# Patient Record
Sex: Female | Born: 1970 | Race: Black or African American | Hispanic: No | Marital: Single | Smoking: Current every day smoker
Health system: Southern US, Community
[De-identification: ages and names within clinical notes are randomized; demographics above are authoritative.]

## PROBLEM LIST (undated history)

## (undated) DIAGNOSIS — D259 Leiomyoma of uterus, unspecified: Secondary | ICD-10-CM

## (undated) DIAGNOSIS — A6000 Herpesviral infection of urogenital system, unspecified: Secondary | ICD-10-CM

## (undated) DIAGNOSIS — F32A Depression, unspecified: Secondary | ICD-10-CM

## (undated) DIAGNOSIS — B1920 Unspecified viral hepatitis C without hepatic coma: Secondary | ICD-10-CM

## (undated) DIAGNOSIS — D509 Iron deficiency anemia, unspecified: Secondary | ICD-10-CM

## (undated) DIAGNOSIS — N920 Excessive and frequent menstruation with regular cycle: Secondary | ICD-10-CM

## (undated) DIAGNOSIS — T7840XA Allergy, unspecified, initial encounter: Secondary | ICD-10-CM

## (undated) DIAGNOSIS — I10 Essential (primary) hypertension: Secondary | ICD-10-CM

## (undated) DIAGNOSIS — F1911 Other psychoactive substance abuse, in remission: Secondary | ICD-10-CM

## (undated) DIAGNOSIS — D649 Anemia, unspecified: Secondary | ICD-10-CM

## (undated) DIAGNOSIS — Z8742 Personal history of other diseases of the female genital tract: Secondary | ICD-10-CM

## (undated) DIAGNOSIS — F1910 Other psychoactive substance abuse, uncomplicated: Secondary | ICD-10-CM

## (undated) DIAGNOSIS — F419 Anxiety disorder, unspecified: Secondary | ICD-10-CM

## (undated) DIAGNOSIS — K219 Gastro-esophageal reflux disease without esophagitis: Secondary | ICD-10-CM

## (undated) HISTORY — PX: COLONOSCOPY WITH PROPOFOL: SHX5780

## (undated) HISTORY — DX: Gastro-esophageal reflux disease without esophagitis: K21.9

## (undated) HISTORY — DX: Essential (primary) hypertension: I10

---
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
## 1999-11-15 DIAGNOSIS — Z8619 Personal history of other infectious and parasitic diseases: Secondary | ICD-10-CM

## 1999-11-15 HISTORY — DX: Personal history of other infectious and parasitic diseases: Z86.19

## 2008-02-21 ENCOUNTER — Emergency Department (HOSPITAL_COMMUNITY): Admission: EM | Admit: 2008-02-21 | Discharge: 2008-02-21 | Payer: Self-pay | Admitting: Family Medicine

## 2008-03-04 ENCOUNTER — Ambulatory Visit: Payer: Self-pay | Admitting: Family Medicine

## 2008-03-09 ENCOUNTER — Emergency Department (HOSPITAL_COMMUNITY): Admission: EM | Admit: 2008-03-09 | Discharge: 2008-03-09 | Payer: Self-pay | Admitting: Emergency Medicine

## 2008-05-27 ENCOUNTER — Emergency Department (HOSPITAL_COMMUNITY): Admission: EM | Admit: 2008-05-27 | Discharge: 2008-05-27 | Payer: Self-pay | Admitting: Emergency Medicine

## 2008-06-05 ENCOUNTER — Encounter: Payer: Self-pay | Admitting: Internal Medicine

## 2008-06-05 ENCOUNTER — Other Ambulatory Visit: Admission: RE | Admit: 2008-06-05 | Discharge: 2008-06-05 | Payer: Self-pay | Admitting: Internal Medicine

## 2008-06-05 ENCOUNTER — Ambulatory Visit: Payer: Self-pay | Admitting: Internal Medicine

## 2008-06-05 LAB — CONVERTED CEMR LAB
AST: 25 units/L (ref 0–37)
Albumin: 4.3 g/dL (ref 3.5–5.2)
Alkaline Phosphatase: 80 units/L (ref 39–117)
BUN: 11 mg/dL (ref 6–23)
Creatinine, Ser: 0.95 mg/dL (ref 0.40–1.20)
Glucose, Bld: 87 mg/dL (ref 70–99)
HDL: 41 mg/dL (ref >39)
LDL Cholesterol: 133 mg/dL — ABNORMAL HIGH (ref 0–99)
Total Bilirubin: 0.6 mg/dL (ref 0.3–1.2)
Total CHOL/HDL Ratio: 4.7
Triglycerides: 99 mg/dL (ref <150)
VLDL: 20 mg/dL (ref 0–40)

## 2008-06-09 ENCOUNTER — Ambulatory Visit (HOSPITAL_COMMUNITY): Admission: RE | Admit: 2008-06-09 | Discharge: 2008-06-09 | Payer: Self-pay | Admitting: Family Medicine

## 2008-11-02 ENCOUNTER — Emergency Department (HOSPITAL_COMMUNITY): Admission: EM | Admit: 2008-11-02 | Discharge: 2008-11-02 | Payer: Self-pay | Admitting: Family Medicine

## 2009-02-10 ENCOUNTER — Encounter (INDEPENDENT_AMBULATORY_CARE_PROVIDER_SITE_OTHER): Payer: Self-pay | Admitting: Internal Medicine

## 2009-02-10 ENCOUNTER — Ambulatory Visit: Payer: Self-pay | Admitting: Internal Medicine

## 2009-02-10 LAB — CONVERTED CEMR LAB
ALT: 14 units/L (ref 0–35)
Alkaline Phosphatase: 75 units/L (ref 39–117)
Basophils Absolute: 0 10*3/uL (ref 0.0–0.1)
Basophils Relative: 0 % (ref 0–1)
CO2: 24 meq/L (ref 19–32)
Creatinine, Ser: 0.92 mg/dL (ref 0.40–1.20)
Eosinophils Absolute: 0.2 10*3/uL (ref 0.0–0.7)
Glucose, Bld: 111 mg/dL — ABNORMAL HIGH (ref 70–99)
MCHC: 33.7 g/dL (ref 30.0–36.0)
MCV: 83.3 fL (ref 78.0–100.0)
Monocytes Relative: 9 % (ref 3–12)
Neutrophils Relative %: 49 % (ref 43–77)
RBC: 4.42 M/uL (ref 3.87–5.11)
RDW: 15 % (ref 11.5–15.5)
Total Bilirubin: 0.3 mg/dL (ref 0.3–1.2)

## 2009-03-09 ENCOUNTER — Ambulatory Visit: Payer: Self-pay | Admitting: Internal Medicine

## 2009-03-21 ENCOUNTER — Emergency Department (HOSPITAL_COMMUNITY): Admission: EM | Admit: 2009-03-21 | Discharge: 2009-03-21 | Payer: Self-pay | Admitting: Emergency Medicine

## 2010-06-23 ENCOUNTER — Emergency Department (HOSPITAL_COMMUNITY): Admission: EM | Admit: 2010-06-23 | Discharge: 2010-06-23 | Payer: Self-pay | Admitting: Emergency Medicine

## 2010-07-07 ENCOUNTER — Ambulatory Visit: Payer: Self-pay | Admitting: Internal Medicine

## 2010-07-16 ENCOUNTER — Ambulatory Visit (HOSPITAL_COMMUNITY): Admission: RE | Admit: 2010-07-16 | Discharge: 2010-07-16 | Payer: Self-pay | Admitting: Family Medicine

## 2010-09-15 ENCOUNTER — Ambulatory Visit: Payer: Self-pay | Admitting: Obstetrics and Gynecology

## 2010-09-26 ENCOUNTER — Emergency Department (HOSPITAL_COMMUNITY): Admission: EM | Admit: 2010-09-26 | Discharge: 2010-09-26 | Payer: Self-pay | Admitting: Family Medicine

## 2010-09-30 ENCOUNTER — Emergency Department (HOSPITAL_COMMUNITY): Admission: EM | Admit: 2010-09-30 | Discharge: 2010-09-30 | Payer: Self-pay | Admitting: Emergency Medicine

## 2010-10-05 ENCOUNTER — Ambulatory Visit (HOSPITAL_COMMUNITY)
Admission: RE | Admit: 2010-10-05 | Discharge: 2010-10-05 | Payer: Self-pay | Source: Home / Self Care | Admitting: Internal Medicine

## 2010-10-14 ENCOUNTER — Ambulatory Visit: Payer: Self-pay | Admitting: Obstetrics and Gynecology

## 2010-10-14 LAB — CONVERTED CEMR LAB: Pap Smear: NEGATIVE

## 2010-12-10 ENCOUNTER — Emergency Department (HOSPITAL_COMMUNITY)
Admission: EM | Admit: 2010-12-10 | Discharge: 2010-12-10 | Payer: Self-pay | Source: Home / Self Care | Admitting: Family Medicine

## 2010-12-10 LAB — POCT RAPID STREP A (OFFICE): Streptococcus, Group A Screen (Direct): NEGATIVE

## 2010-12-11 ENCOUNTER — Emergency Department (HOSPITAL_COMMUNITY)
Admission: EM | Admit: 2010-12-11 | Discharge: 2010-12-11 | Payer: Self-pay | Source: Home / Self Care | Admitting: Emergency Medicine

## 2010-12-17 ENCOUNTER — Emergency Department (HOSPITAL_COMMUNITY)
Admission: EM | Admit: 2010-12-17 | Discharge: 2010-12-17 | Disposition: A | Discharge status: Home / Self Care | Payer: Medicaid Other | Attending: Emergency Medicine | Admitting: Emergency Medicine

## 2010-12-17 DIAGNOSIS — M5380 Other specified dorsopathies, site unspecified: Secondary | ICD-10-CM | POA: Insufficient documentation

## 2010-12-17 DIAGNOSIS — M545 Low back pain, unspecified: Secondary | ICD-10-CM | POA: Insufficient documentation

## 2011-01-07 ENCOUNTER — Emergency Department (HOSPITAL_COMMUNITY)
Admission: EM | Admit: 2011-01-07 | Discharge: 2011-01-07 | Disposition: A | Discharge status: Home / Self Care | Payer: Medicaid Other | Attending: Emergency Medicine | Admitting: Emergency Medicine

## 2011-01-07 DIAGNOSIS — H5789 Other specified disorders of eye and adnexa: Secondary | ICD-10-CM | POA: Insufficient documentation

## 2011-01-25 LAB — POCT URINALYSIS DIPSTICK
Glucose, UA: NEGATIVE mg/dL
Ketones, ur: NEGATIVE mg/dL
Specific Gravity, Urine: 1.015 (ref 1.005–1.030)

## 2011-01-25 LAB — WET PREP, GENITAL
Trich, Wet Prep: NONE SEEN
Yeast Wet Prep HPF POC: NONE SEEN

## 2011-01-25 LAB — POCT PREGNANCY, URINE: Preg Test, Ur: NEGATIVE

## 2011-01-28 LAB — URINALYSIS, ROUTINE W REFLEX MICROSCOPIC
Ketones, ur: NEGATIVE mg/dL
Nitrite: NEGATIVE
Specific Gravity, Urine: 1.026 (ref 1.005–1.030)
pH: 6 (ref 5.0–8.0)

## 2011-01-28 LAB — COMPREHENSIVE METABOLIC PANEL
ALT: 11 U/L (ref 0–35)
AST: 22 U/L (ref 0–37)
CO2: 26 mEq/L (ref 19–32)
Calcium: 9.1 mg/dL (ref 8.4–10.5)
GFR calc Af Amer: >60 mL/min (ref >60)
GFR calc non Af Amer: >60 mL/min (ref >60)
Sodium: 137 mEq/L (ref 135–145)

## 2011-01-28 LAB — DIFFERENTIAL
Basophils Relative: 0 % (ref 0–1)
Eosinophils Relative: 1 % (ref 0–5)
Lymphs Abs: 2.6 10*3/uL (ref 0.7–4.0)
Monocytes Absolute: 0.8 10*3/uL (ref 0.1–1.0)

## 2011-01-28 LAB — CBC
Hemoglobin: 12.8 g/dL (ref 12.0–15.0)
MCHC: 35.5 g/dL (ref 30.0–36.0)
RBC: 4.34 MIL/uL (ref 3.87–5.11)
WBC: 8.8 10*3/uL (ref 4.0–10.5)

## 2011-01-28 LAB — POCT PREGNANCY, URINE: Preg Test, Ur: NEGATIVE

## 2011-02-11 ENCOUNTER — Ambulatory Visit (INDEPENDENT_AMBULATORY_CARE_PROVIDER_SITE_OTHER): Payer: Medicaid Other | Admitting: Obstetrics & Gynecology

## 2011-02-11 ENCOUNTER — Encounter: Payer: Self-pay | Admitting: *Deleted

## 2011-02-11 DIAGNOSIS — N898 Other specified noninflammatory disorders of vagina: Secondary | ICD-10-CM

## 2011-02-11 LAB — CONVERTED CEMR LAB: WBC, Wet Prep HPF POC: NONE SEEN

## 2011-02-12 NOTE — Progress Notes (Signed)
Latoya Klein, GLADUE NO.:  [id number]  MEDICAL RECORD NO.:  [id number]           PATIENT TYPE:  A  LOCATION:  [facility]                   FACILITY:  [facility]  PHYSICIAN:  Scheryl Darter, MD       DATE OF BIRTH:  1971-05-19  DATE OF SERVICE:  02/11/2011                                 CLINIC NOTE  REASON FOR VISIT:  Vaginal discharge.  HISTORY OF PRESENT ILLNESS:  The patient complaints today of a vaginal discharge, this is white in color.  She also reports some odor and itching for the last week.  She states that this is similar to a previous C-section which got better with oral Diflucan.  She has not started with any treatments so far.  She does not have any new partner and she has no concern for STDs.  She denies any pain with intercourse, bleeding, cramping or abdominal pain.  PHYSICAL EXAMINATION:  VITAL SIGNS:  Temperature 98.0, pulse 67, blood pressure 132/89, weight 186.6. GENERAL:  She is alert in no acute distress, resting comfortably on exam table. ABDOMEN:  Obese, nontender, nondistended.  No palpable masses or hepatosplenomegaly. GYNECOLOGY:  External genitalia is normal, although a thick white discharge is present at the introitus.  Vagina with no lesions, normal rugation.  A thick white discharge is present.  Cervix without lesions or discharge, normal color.  Allergies, no known drug allergies.  Date of last menstrual period February 02, 2011.  Date of last Pap October 14, 2010.  ASSESSMENT AND PLAN:  This is a 40 year old female with 1 week of thick white vaginal discharge.  PLAN:  We will treat with Diflucan 150 mg p.o., repeat in 3 days.  Also once wet prep results are back, if BV is present, we will treat her for that as well.  The patient should follow up for annual exam in December what will be 1 year since her last exam.    ______________________________ Ellery Plunk, MD   ______________________________ Scheryl Darter,  MD   RS/MEDQ  D:  02/11/2011  T:  02/12/2011  Job:  [id number]

## 2011-02-22 LAB — POCT RAPID STREP A (OFFICE): Streptococcus, Group A Screen (Direct): NEGATIVE

## 2011-02-22 LAB — POCT INFECTIOUS MONO SCREEN: Mono Screen: NEGATIVE

## 2011-05-26 ENCOUNTER — Encounter: Payer: Self-pay | Admitting: Physician Assistant

## 2011-05-26 ENCOUNTER — Ambulatory Visit (INDEPENDENT_AMBULATORY_CARE_PROVIDER_SITE_OTHER): Payer: Medicaid Other | Admitting: Physician Assistant

## 2011-05-26 ENCOUNTER — Other Ambulatory Visit: Payer: Self-pay | Admitting: Physician Assistant

## 2011-05-26 VITALS — BP 138/89 | HR 78 | Temp 98.5°F | Ht 66.0 in | Wt 178.7 lb

## 2011-05-26 DIAGNOSIS — N898 Other specified noninflammatory disorders of vagina: Secondary | ICD-10-CM

## 2011-05-26 DIAGNOSIS — N760 Acute vaginitis: Secondary | ICD-10-CM | POA: Insufficient documentation

## 2011-05-26 DIAGNOSIS — Z113 Encounter for screening for infections with a predominantly sexual mode of transmission: Secondary | ICD-10-CM

## 2011-05-26 LAB — RPR

## 2011-05-26 NOTE — Patient Instructions (Signed)
Sexually Transmitted Disease Sexually transmitted disease (STD) refers to any infection that is passed from person to person during sexual activity. This may happen by way of saliva, semen, blood, vaginal mucus, or urine. Infections are passed in many ways. For example, the common cold could be passed during sexual activity, but it is not considered a sexually transmitted infection. CAUSES Sexual activity is the contact between the genitals of one partner and the genitals, anus, eyes, mouth, or throat of another. These activities include sexual intercourse, the sharing of sex toys, needles, or any other intimate contact of the genitals, mouth, or rectal areas. An STD may be spread by bacteria (germ), virus, or parasite. These small germ-like agents can enter the body and infect the skin and linings of the:  Vagina.  Rectum.   Urethra.   Cervix.  Eyes.   Mouth.  Throat.   HIV/AIDS and hepatitis B infection can also be spread by needles, through the blood. Common STDs include:  Gonorrhea.  Chlamydia.   Syphilis.   HIV/AIDS (human immunodeficiency virus / acquired immunodeficiency syndrome).   Genital herpes.  Hepatitis B.   Trichomonas.   Human papilloma virus (HPV).   Pubic lice.   SYMPTOMS Some people may not have any symptoms, but can still transmit the infection to others.  Painful or bloody urination.   Pain in the pelvis, abdomen, vagina, anus, throat, or eyes. Where it hurts depends on the type of sexual contact.    Skin rash, itching, irritation, growths, or lesions (sores, ulcer). These usually occur in the genital or anal area. These growths may or may not be painful, irritated, or itch.   Abnormal vaginal discharge.   Fever.   Pain or bleeding during sexual intercourse.   Swollen glands in the groin area.   Yellow skin and eyes, seen with hepatitis (jaundice).  RISK FACTORS   Having multiple sex partners.   Having a sex partner who has other sex  partners.   Taking illegal drugs or drinking too much alcohol. This can affect your judgment and put you in a vulnerable position.   Having unprotected sex, not using condoms.   Having open sores on your skin or in your mouth, during sexual activity.   Being involved in high-risk sexual acts.   Engaging in oral or anal sex.Marland Kitchen  DIAGNOSIS  Diagnosis of sexually transmitted infections begins with your caregiver taking your medical history and performing a physical exam. Additional testing may be required.   Cultures are used to diagnose some STDs, including gonorrhea and chlamydia. A specimen is taken, grown in the laboratory for a day or two, and then identified. Newer tests can diagnose certain STDs, such as chlamydia, within minutes.   Trichomonas or syphilis can be seen through a microscope, in a sample of discharge.   Syphilis can be seen through a microscope or diagnosed with a blood test.   HIV and hepatitis B can be diagnosed with a blood test.   Pubic lice, which look like tiny bugs in the pubic hair, can usually be seen with a magnifying glass or a microscope.   The appearance of sores or blisters on the skin is enough to make a diagnosis and begin treatment for genital herpes.   HPV (human papilloma virus) is seen on a Pap test. There are several types of HPV that can cause cervical cancer.   Colposcopy (applying special solution and looking at the cervix with a lighted, magnifying tube) is used to see the problem  better when diagnosing HPV.   Laparoscopy (looking into the pelvis at the female organs, with a lighted tube, through a small incision) can also be used for diagnosis.  TREATMENT  Chlamydia, gonorrhea, trichomonas, and syphilis can be cured with antibiotics (injected, oral, vaginal creams, suppositories). These are medicines that kill germs.   Genital herpes, hepatitis, and HIV cannot be cured. They often can be treated with medicines, to lessen the symptoms.    Genital warts from HPV can be removed with medicine, freezing, electrocautery (burning), or surgery. But the warts may come back.   All sexual partners should be informed, tested, and treated for all STDs.   Surgery can be used for removal of HPV of the cervix, vagina, or vulva.   If you have one STD, you are also at risk for all others. If one is discovered, treatment often will be started to cover other possible infections. This may be done even if other infections are not proven with testing.  HOME CARE INSTRUCTIONS AND PREVENTION  Finish all medicine as prescribed. Incomplete treatment of chlamydia and gonorrhea puts women at risk of becoming sterile (unable to have children). Women are also at risk of having a tubal pregnancy (pregnancy outside the uterus), which can be life threatening. Sterility or tubal pregnancies can occur even in fully treated individuals. Following the prescribed treatment decreases the chance. That is why it is important to finish ALL medicines given to you.   Return immediately if you develop an oral temperature of 100.5 or higher.   Only take over-the-counter or prescription medicines for pain, discomfort, or fever as directed by your caregiver.   Rest and eat a balanced diet with plenty of fluids.   Do not have sex until treatment is completed and you have followed up at your caregiver's office or the clinic to which you were referred. If your diagnosis is confirmed by culture or another method, your recent sexual partners need treatment. This is true even if they are problem free or have a negative culture or evaluation. They also should not have sex until their caregiver says it is ok.  STDs should be checked after treatment.  HIV and hepatitis need frequent blood tests and follow-up examinations. This is to monitor the effects of the infection on your body. Any new or worsening symptoms should be reported to your caregiver.   HPV needs follow-up Pap  tests.   Only use latex condoms and water-soluble lubricants. Do not use vaseline or oils.   Avoid alcohol and illegal drugs, because they can affect your mind, and you may end up not practicing safe sex.   A vaccine is available for HPV and hepatitis. Everyone should get the vaccine.   Avoid risky sex practices that can break the skin, because it makes you more at risk for an STD.   Oral, vaginal ring, patches, hormone injection contraceptives, spermicides, diaphragm, IUD, and cervical caps do not protect against STDs.  PROGNOSIS Long-term effects vary, depending on the type and severity of the STD, and the effectiveness of the treatment. An STD can be treated and cured in one week, one or more months, or an STD and its symptoms can last for many years, or even a lifetime (HIV and hepatitis). STDs can also cause damage to the female organs, chronic pelvic pain, infertility, and recurrences of the STD, especially genital herpes, hepatitis, and HIV.   Trichomonas and pubic lice have few or no long-term effects, other than continued symptoms.  Frequent STDs can cause:   Chronic (ongoing) pelvic pain, or closing of the urethra in the penis.   Chlamydia and gonorrhea can cause:   Infertility.   Chronic pelvic inflammatory disease, and chronic pain.   HPV infections increase a woman's risk of having abnormal cells in her cervix and developing cervical cancer.   HPV causes genital warts, which can come back even after treatment.   Several types of HPV (not warts) cause cervical cancer. HPV is the main cause of cervical cancer.   HIV can progress to AIDS and result in death.    Hepatitis B can cause permanent liver damage, liver cancer, and death.    Syphilis can be cured in the early stages. But in advanced stages, it causes permanent brain and heart damage, and death.   Syphilis during pregnancy, if not treated, can cause:   Deformities.   Death of the baby.  WARNING:You have  been diagnosed with an STD, or you may have an STD. All sexually transmitted infections are contagious. People who have an STD or are being evaluated for a possible STD should not have sexual contact with another person until they receive treatment, until the infection has cleared, or until tests are negative for STD. All STDs can be transmitted to babies before or during birth. Effects of STD infection on babies depend on the infection and the effectiveness of treatment. Effects can include infections, birth defects, and even death. SEEK MEDICAL CARE IF:  You think you have an STD, even if you do not have symptoms. Call your caregiver for evaluation and treatment, if needed.   You think or know your sex partner has an STD.   You have any of the symptoms mentioned above.  Document Released: 01/21/2003 Document Re-Released: 01/25/2010 [facility] Patient Information 2011 [place], [place].Preparing for Pregnancy Preparing for pregnancy (preconceptual care) by getting counseling and information from your caregiver before getting pregnant is a good idea. It will help you and your baby have a better chance to have a healthy, safe pregnancy and delivery of your baby. Make an appointment with your caregiver to talk about your health, medical and family history and how to prepare yourself before getting pregnant. Your caregiver will do a complete physical exam and a Pap test. They will want to know:  About you, your spouse/partner and your family's medical and genetic history.   If you are eating a balanced diet and drinking enough fluids.   What vitamins and mineral supplements you are taking. This includes taking folic acid before getting pregnant to help prevent birth defects.   What medications you are taking including prescription, over-the-counter and herbal medications.   If there is any substance abuse like alcohol, smoking and illegal drugs.   If there is any mental or physical domestic violence.    If there is any risk of sexually transmitted disease between you and your partner.   What immunizations and vaccinations you have had and what you may need before getting pregnant.   If you should get tested for HIV infection.   If there is any exposure to chemical or toxic substances at home or work.   If there are medical problems you have that need to be treated and kept under control before getting pregnant such as diabetes, high blood pressure or others.   If there were any past surgeries, pregnancies and problems with them.   What your current weight is and to set a goal as to how much weight  you should gain while pregnant. Also, they will check if you should lose or gain weight before getting pregnant.   What is your exercise routine and what it is safe when you are pregnant.   If there are any physical disabilities that need to be addressed.   About spacing your pregnancies when there are other children.   If there is a financial problem that may affect you having a child.  After talking about the above points with your caregiver, your caregiver will give you advice on how to help treat and work with you on solving any issues, if necessary, before getting pregnant. The goal is to have a healthy and safe pregnancy for you and your baby. You should keep an accurate record of your menstrual periods because it will help in determining your due date. Immunizations that you should have before getting pregnant:   Regular measles, [place] measles (rubella) and mumps.  Tetanus and diphtheria.   Chicken pox, if not immune.   Herpes zoster (Varicella) if not immune.  Human papilloma virus vaccine (HPV) between the age of 35 and 49 years old).   Hepatitis A vaccine.  Hepatitis B vaccine.   Influenza vaccine.   Pneumococcal vaccine (pneumonia).   You should avoid getting pregnant for one month after getting vaccinated with a live virus vaccine such as [place] measles (rubella)  vaccine. Other immunizations may be necessary depending on where you live, such as malaria. Ask your caregiver if any other immunizations are needed for you. HOME CARE INSTRUCTIONS  Follow the advice of your caregiver.   Before getting pregnant:   Begin taking vitamins, supplements and folic acid 0.4 milligrams/day.   Get your immunizations up to date.   Get help from a nutrition counselor if you do not understand what a balanced diet is, need help with a special medical diet or if you need help to lose or gain weight.   Begin exercising.   Stop smoking, taking illegal drugs and drinking alcoholic beverages.   Get counseling if there is and type of domestic violence.   Get checked for sexually transmitted diseases including HIV.   Get any medical problems under control (diabetes, high blood pressure, convulsions, asthma or others).   Resolve any financial concerns.   Be sure you and your spouse/partner are ready to have a baby.   Keep an accurate record of your menstrual periods.  Document Released: 10/13/2008  [facility] Patient Information 2011 [place], [place].Vaginitis Vaginitis in a soreness, swelling and redness (inflammation) of the vagina and vulva. This is not a sexually transmitted infection.   CAUSES Yeast vaginitis is caused by yeast (candida) that is normally found in your vagina. With a yeast infection, the candida has over grown in number to a point that upsets the chemical balance. SYMPTOMS   White thick vaginal discharge.   Swelling, itching, redness and irritation of the vagina and possibly the lips of the vagina (vulva).   Burning or painful urination.   Painful intercourse.  HOME CARE INSTRUCTIONS  Finish all medication as prescribed.   Do not have sex until treatment is completed or instructed by your healthcare giver.   Take warm sitz baths.   Do not douche.   Do not use tampons, especially scented ones.   Wear cotton underwear.   Avoid  tight pants and panty hose.   Tell your sexual partner that you have a yeast infection. They should go to their caregiver if they have symptoms such as mild rash  or itching.   Your sexual partner should be treated if your infection is difficult to eliminate.   Practice safer sex. Use condoms.   Some vaginal medications cause latex condoms to fail. Ask your caregiver this.  SEEK MEDICAL CARE IF:  You develop a fever.   The infection is getting worse after 2 days of treatment.   The infection is not getting better after 3 days of treatment.   You develop blisters in or around your vagina.   You develop vaginal bleeding, and it is not your menstrual period.   You have pain when you urinate.   You develop intestinal problems.   You have pain with sexual intercourse.  Document Released: 12/08/2004 Document Re-Released: 08/28/2009 [facility] Patient Information 2011 [place], [place].Place vaginitis patient instructions here.

## 2011-05-26 NOTE — Progress Notes (Signed)
Vaginal discharge x 2 weeks slight odor

## 2011-05-26 NOTE — Progress Notes (Signed)
  Subjective:    Latoya Klein is a 40 y.o. female who presents for sexually transmitted disease check. Sexual history reviewed with the patient. STI Exposure: Reports new sexual partner since last STI screening. Previous history of STI none. Current symptoms vaginal discharge: yellow and thick x 2 weeks. Contraception: None, desiring pregnancy Menstrual History:   Patient's last menstrual period was 05/16/2011.    The following portions of the patient's history were reviewed and updated as appropriate: allergies, current medications, past family history, past medical history, past social history, past surgical history and problem list.  Review of Systems Pertinent items are noted in HPI.    Objective:    BP 138/89  Pulse 78  Temp(Src) 98.5 F (36.9 C) (Oral)  Ht 5\' 6"  (1.676 m)  Wt 178 lb 11.2 oz (81.058 kg)  BMI 28.84 kg/m2  LMP 05/16/2011 General:   alert, cooperative, appears stated age and no distress  Lymph Nodes:   Cervical, supraclavicular, and axillary nodes normal.  Pelvis:  External genitalia: normal general appearance Vaginal: normal mucosa without prolapse or lesions, normal without tenderness, induration or masses and discharge, Brown Cervix: normal appearance Adnexa: normal bimanual exam Uterus: normal single, nontender Clinical staff offered to be present for exam: yes  Initials: [facility]  Cultures:  GC and Chlamydia genprobes and Wet prep     Assessment:    Possible STD exposure    Plan:    Discussed safe sexual practice in detail See orders for STD cultures and assays See orders. Pt will call for results RTC PRN

## 2011-05-27 LAB — CHLAMYDIA TRACHOMATIS, DNA, AMP PROBE: Chlamydia, DNA Probe: NEGATIVE

## 2011-05-27 LAB — WET PREP, GENITAL: Clue Cells Wet Prep HPF POC: NONE SEEN

## 2011-05-27 LAB — HEPATITIS C ANTIBODY: HCV Ab: REACTIVE — AB

## 2011-05-30 ENCOUNTER — Other Ambulatory Visit: Payer: Medicaid Other

## 2011-05-30 DIAGNOSIS — B1920 Unspecified viral hepatitis C without hepatic coma: Secondary | ICD-10-CM

## 2011-05-30 DIAGNOSIS — Z0189 Encounter for other specified special examinations: Secondary | ICD-10-CM

## 2011-05-30 LAB — HEPATIC FUNCTION PANEL
Albumin: 4.6 g/dL (ref 3.5–5.2)
Total Protein: 8 g/dL (ref 6.0–8.3)

## 2011-06-29 ENCOUNTER — Ambulatory Visit: Payer: Medicaid Other | Admitting: Obstetrics & Gynecology

## 2011-07-24 ENCOUNTER — Emergency Department (HOSPITAL_COMMUNITY)
Admission: EM | Admit: 2011-07-24 | Discharge: 2011-07-24 | Disposition: A | Discharge status: Home / Self Care | Payer: Medicaid Other | Attending: Emergency Medicine | Admitting: Emergency Medicine

## 2011-07-24 DIAGNOSIS — A499 Bacterial infection, unspecified: Secondary | ICD-10-CM | POA: Insufficient documentation

## 2011-07-24 DIAGNOSIS — R1032 Left lower quadrant pain: Secondary | ICD-10-CM | POA: Insufficient documentation

## 2011-07-24 DIAGNOSIS — N760 Acute vaginitis: Secondary | ICD-10-CM | POA: Insufficient documentation

## 2011-07-24 DIAGNOSIS — B9689 Other specified bacterial agents as the cause of diseases classified elsewhere: Secondary | ICD-10-CM | POA: Insufficient documentation

## 2011-07-24 LAB — URINALYSIS, ROUTINE W REFLEX MICROSCOPIC
Bilirubin Urine: NEGATIVE
Hgb urine dipstick: NEGATIVE
Ketones, ur: NEGATIVE mg/dL
Specific Gravity, Urine: 1.024 (ref 1.005–1.030)
Urobilinogen, UA: 1 mg/dL (ref 0.0–1.0)

## 2011-07-24 LAB — POCT PREGNANCY, URINE: Preg Test, Ur: NEGATIVE

## 2011-07-24 LAB — WET PREP, GENITAL: Trich, Wet Prep: NONE SEEN

## 2011-07-25 LAB — GC/CHLAMYDIA PROBE AMP, GENITAL
Chlamydia, DNA Probe: NEGATIVE
GC Probe Amp, Genital: NEGATIVE

## 2011-08-09 LAB — POCT URINALYSIS DIP (DEVICE)
Nitrite: NEGATIVE
Protein, ur: NEGATIVE
Specific Gravity, Urine: <=1.005
Urobilinogen, UA: 0.2

## 2011-08-09 LAB — URINE CULTURE

## 2011-08-09 LAB — POCT PREGNANCY, URINE: Preg Test, Ur: NEGATIVE

## 2011-08-19 LAB — POCT RAPID STREP A: Streptococcus, Group A Screen (Direct): NEGATIVE

## 2011-08-23 IMAGING — CT CT ABD-PELV W/ CM
3 of 5 series · 14 of 32 positions shown, 19 images · IV contrast (water/omni  & 100ml omni 300)
Comparison: None.

CLINICAL DATA: Mid abdominal pain, nausea and vomiting.

CT ABDOMEN AND PELVIS WITH CONTRAST
TECHNIQUE: Multidetector CT imaging of the abdomen and pelvis was
performed following the standard protocol during bolus
administration of intravenous contrast.
Contrast: 100 mL of Omnipaque 300 IV contrast

[Series 2: routine abdomen · axial · 0.81mm/px · z∈[-400,-130]mm · 4 of 92 slices shown, 9 images]
[im 19/92  soft-tissue]
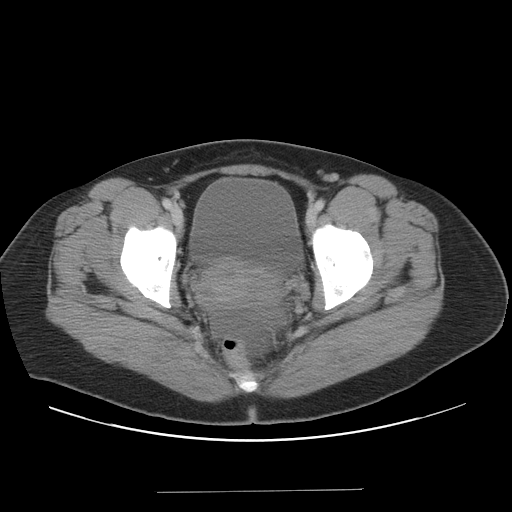
[im 19/92  lung]
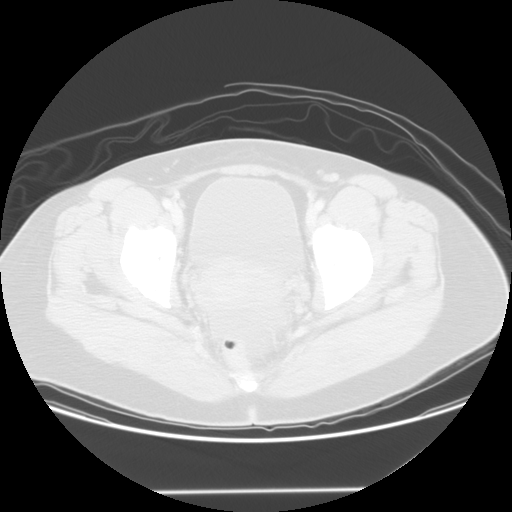
[im 19/92  bone]
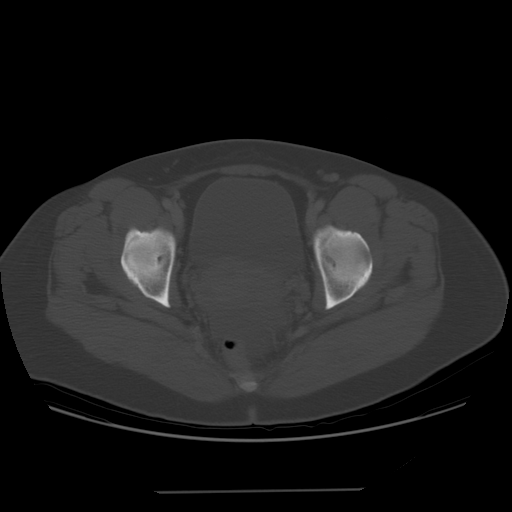
[im 37/92  soft-tissue]
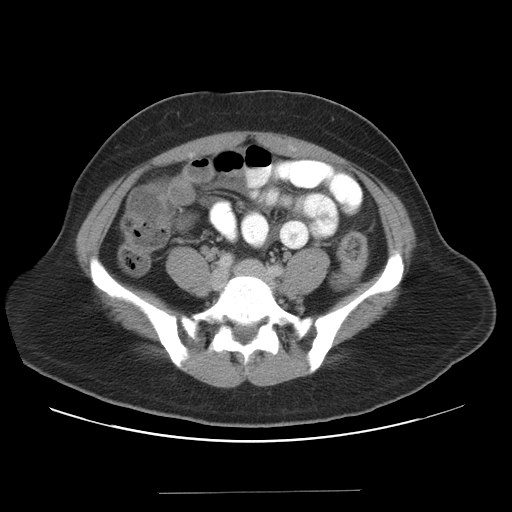
[im 37/92  lung]
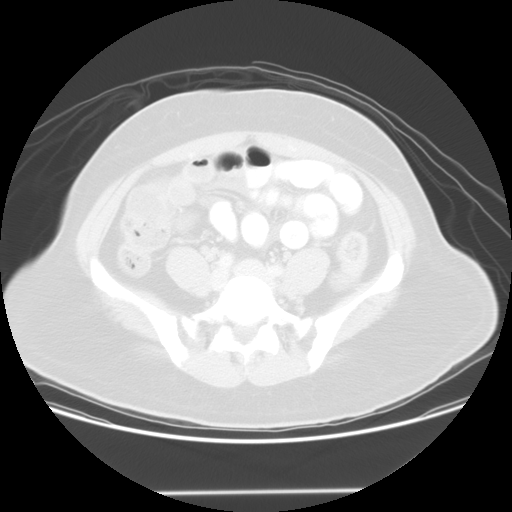
[im 55/92  soft-tissue]
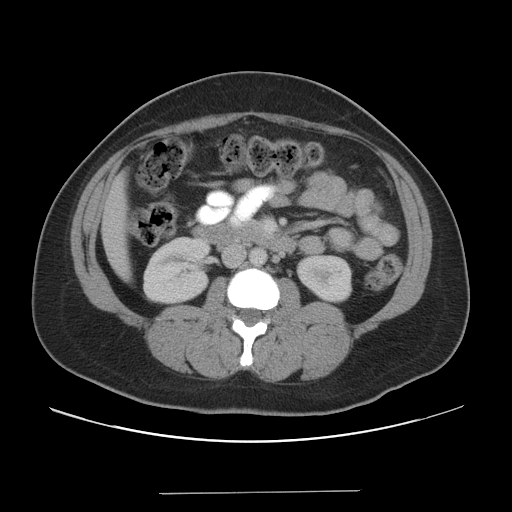
[im 55/92  lung]
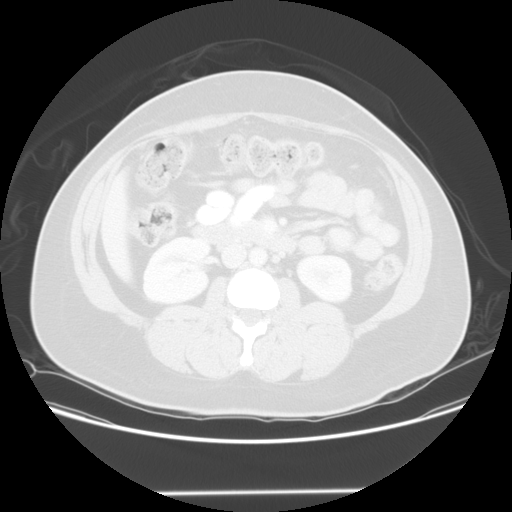
[im 73/92  soft-tissue]
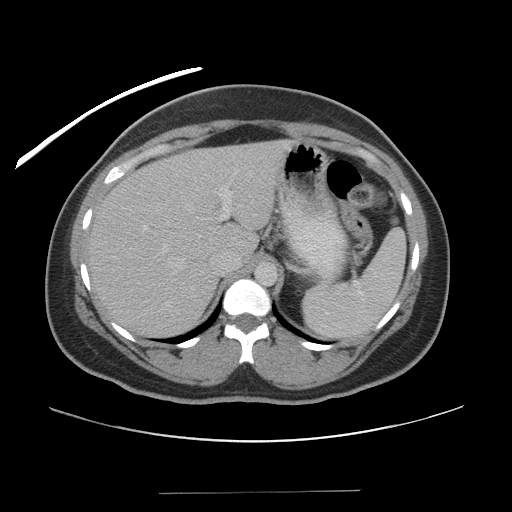
[im 73/92  lung]
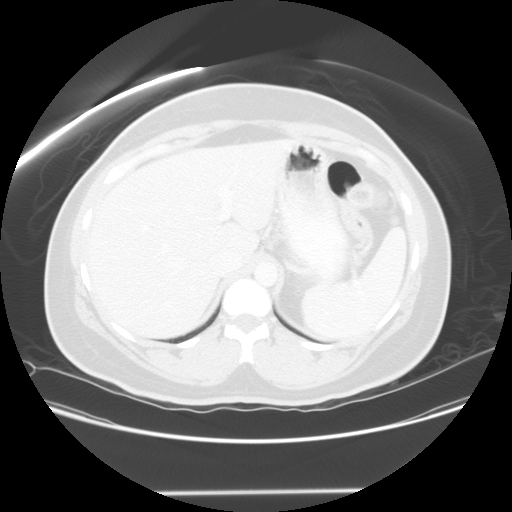

[Series 400: reformatted · sagittal · 0.94mm/px · 8 of 186 slices shown (1 of 2)]
[im 19/186  soft-tissue]
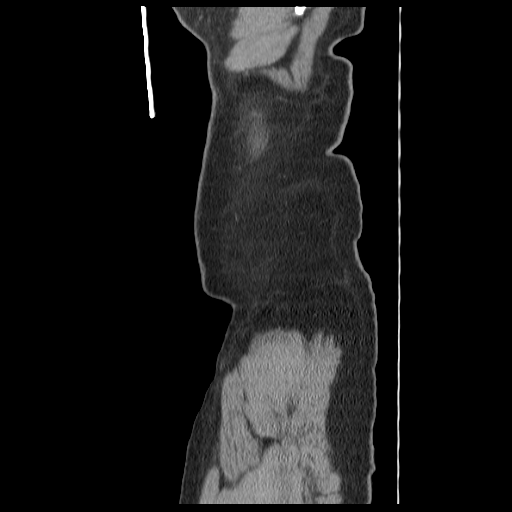
[im 38/186  soft-tissue]
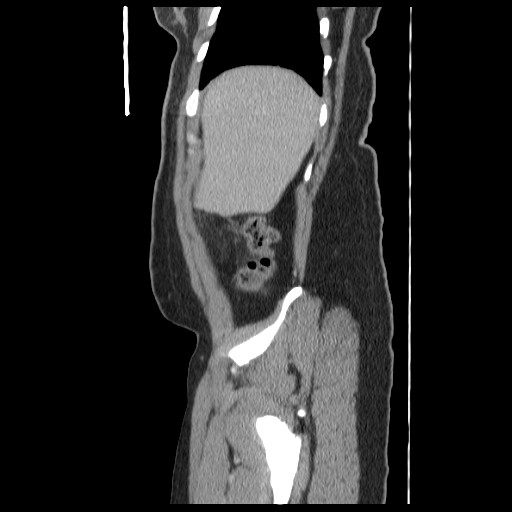
[im 56/186  soft-tissue]
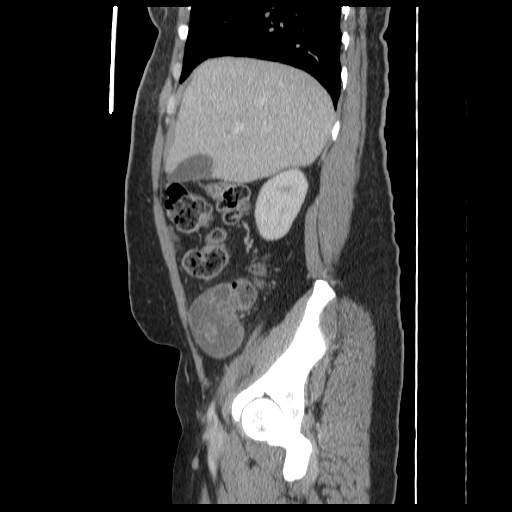
[im 75/186  soft-tissue]
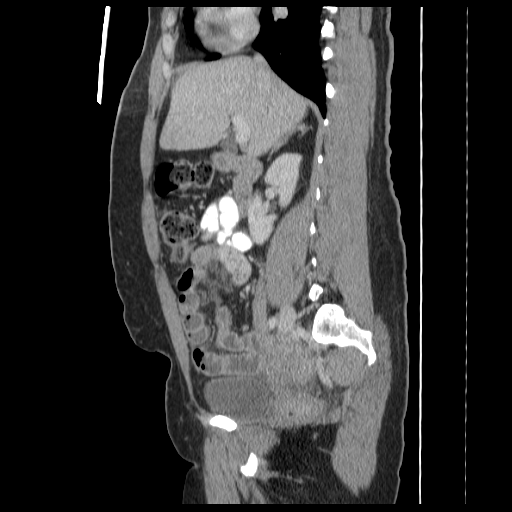
[im 112/186  soft-tissue]
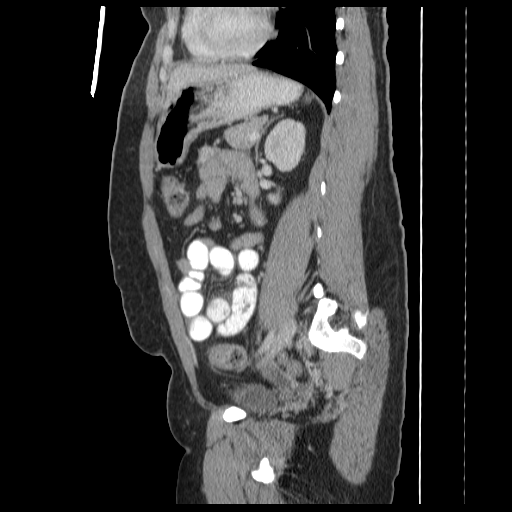
[im 130/186  soft-tissue]
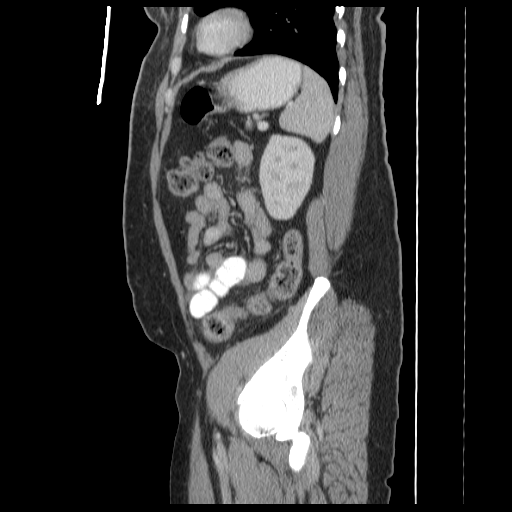
[im 149/186  soft-tissue]
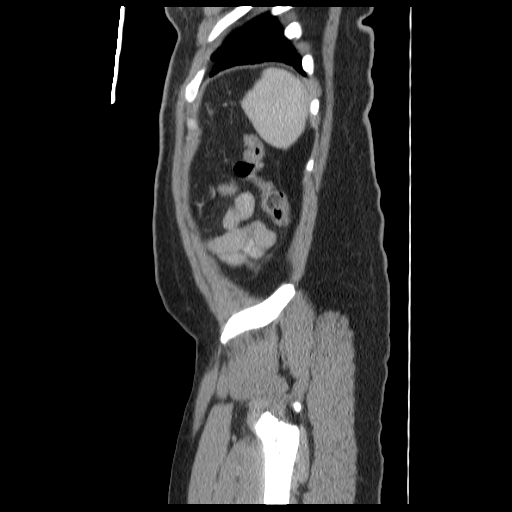
[im 167/186  soft-tissue]
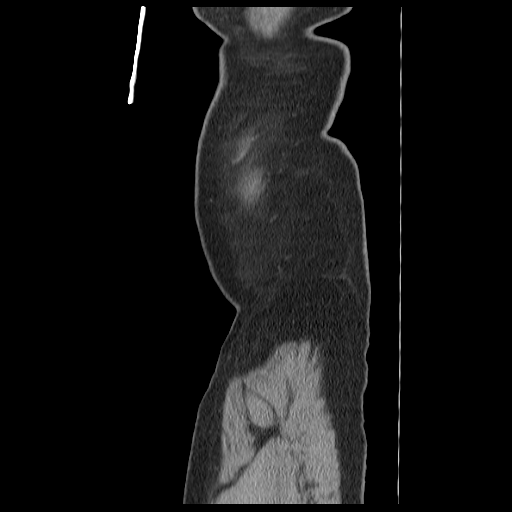

[Series 401: reformatted · coronal · 0.94mm/px · 2 of 168 slices shown (2 of 2)]
[im 19/168  soft-tissue]
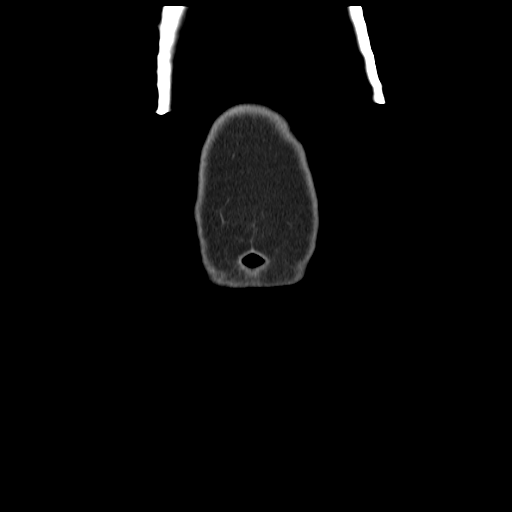
[im 38/168  soft-tissue]
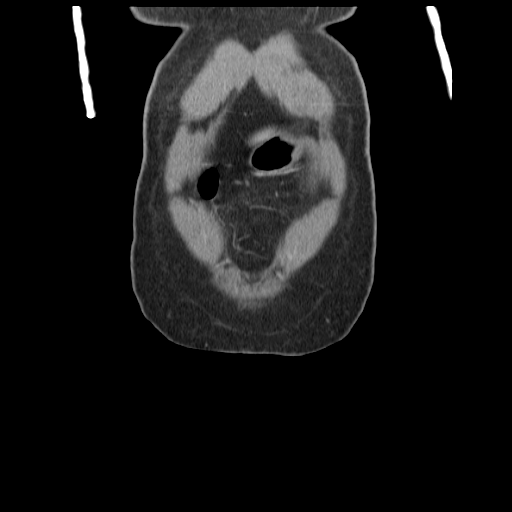

[14 of 32 positions shown; findings below may reference images not displayed]

FINDINGS: The visualized lung bases are clear.

The liver and spleen are unremarkable in appearance.  The
gallbladder is within normal limits.  The pancreas and adrenal
glands are unremarkable.  The kidneys are within normal limits
bilaterally; no hydronephrosis or perinephric stranding is seen.

No free fluid is identified.  The small bowel is unremarkable in
appearance.  The stomach is within normal limits.  No acute
vascular abnormalities are seen.

The appendix is normal in caliber, without evidence for
appendicitis.  The colon is unremarkable appearance.

The bladder is mildly distended and is within normal limits.  The
uterus is grossly unremarkable in appearance, with air noted about
the vagina.

There is a flattened cystic structure at the left adnexa, and a
small amount of free fluid is noted within the pelvis; this could
reflect a ruptured cyst.  The ovaries are otherwise unremarkable in
appearance.  No suspicious adnexal masses are seen.  No inguinal
lymphadenopathy is appreciated.

No acute osseous abnormalities are identified.
IMPRESSION: 1.  Question flattened cystic structure at the left adnexa, with a
small amount of free fluid in the pelvis.  This could reflect a
ruptured left ovarian cyst.
2.  Otherwise unremarkable CT of the abdomen and pelvis.

## 2011-08-29 ENCOUNTER — Inpatient Hospital Stay (INDEPENDENT_AMBULATORY_CARE_PROVIDER_SITE_OTHER)
Admission: RE | Admit: 2011-08-29 | Discharge: 2011-08-29 | Disposition: A | Discharge status: Home / Self Care | Payer: Medicaid Other | Source: Ambulatory Visit | Attending: Family Medicine | Admitting: Family Medicine

## 2011-08-29 DIAGNOSIS — J019 Acute sinusitis, unspecified: Secondary | ICD-10-CM

## 2011-08-29 DIAGNOSIS — J029 Acute pharyngitis, unspecified: Secondary | ICD-10-CM

## 2012-01-04 ENCOUNTER — Other Ambulatory Visit (HOSPITAL_COMMUNITY): Payer: Self-pay | Admitting: Family Medicine

## 2012-01-04 DIAGNOSIS — Z1231 Encounter for screening mammogram for malignant neoplasm of breast: Secondary | ICD-10-CM

## 2012-01-16 ENCOUNTER — Encounter: Payer: Self-pay | Admitting: Family Medicine

## 2012-01-16 ENCOUNTER — Ambulatory Visit (INDEPENDENT_AMBULATORY_CARE_PROVIDER_SITE_OTHER): Payer: Medicaid Other | Admitting: Family Medicine

## 2012-01-16 VITALS — BP 133/85 | HR 74 | Temp 97.9°F | Ht 67.0 in | Wt 182.7 lb

## 2012-01-16 DIAGNOSIS — N898 Other specified noninflammatory disorders of vagina: Secondary | ICD-10-CM

## 2012-01-16 LAB — POCT URINALYSIS DIP (DEVICE)
Bilirubin Urine: NEGATIVE
Glucose, UA: NEGATIVE mg/dL
Ketones, ur: NEGATIVE mg/dL
Leukocytes, UA: NEGATIVE

## 2012-01-16 NOTE — Progress Notes (Signed)
Pt c/o dysuria and vaginal d/c of clear mucous with itch. No odor.

## 2012-01-16 NOTE — Progress Notes (Signed)
  Subjective:    Patient ID: Latoya Klein, female    DOB: June 20, 1971, 41 y.o.   MRN: [id number]  HPI This 41 year old female presents to the clinic with 3 days of mucus vaginal discharge with internal vaginal pruritis.  She denies dysuria, fevers, chills, nausea, vomiting, abdominal pain.  No palliating or provoking factors.  She finished her menses the day before the discharge and pruritis began.  Has not had this before, though has had recurrent BV infections.  This discharge appears different than the prior.  Review of Systems     Objective:   Physical Exam  Constitutional: She is oriented to person, place, and time. She appears well-developed and well-nourished.  Genitourinary:       External genitalia normal.  Cervix appears normal.  Thick whitish discharge.  Neurological: She is alert and oriented to person, place, and time.  Skin: Skin is warm and dry.  Psychiatric: She has a normal mood and affect. Her behavior is normal. Judgment and thought content normal.       Assessment & Plan:  1.  Vaginal discharge. Wet prep collected and sent to lab.

## 2012-01-17 LAB — WET PREP, GENITAL
Clue Cells Wet Prep HPF POC: NONE SEEN
Trich, Wet Prep: NONE SEEN
WBC, Wet Prep HPF POC: NONE SEEN
Yeast Wet Prep HPF POC: NONE SEEN

## 2012-01-19 ENCOUNTER — Telehealth: Payer: Self-pay | Admitting: *Deleted

## 2012-01-19 NOTE — Telephone Encounter (Signed)
Pt left a message requesting lab results.

## 2012-01-19 NOTE — Telephone Encounter (Signed)
Advised patient of wet prep result which was negative for yeast dated 01/16/12. Patient agrees and satisfied.

## 2012-01-19 NOTE — Telephone Encounter (Signed)
Patient call wanting results of lab work.

## 2012-02-06 ENCOUNTER — Ambulatory Visit (HOSPITAL_COMMUNITY)
Admission: RE | Admit: 2012-02-06 | Discharge: 2012-02-06 | Disposition: A | Discharge status: Home / Self Care | Payer: Medicaid Other | Source: Ambulatory Visit | Attending: Family Medicine | Admitting: Family Medicine

## 2012-02-06 DIAGNOSIS — Z1231 Encounter for screening mammogram for malignant neoplasm of breast: Secondary | ICD-10-CM | POA: Insufficient documentation

## 2012-03-07 ENCOUNTER — Emergency Department (INDEPENDENT_AMBULATORY_CARE_PROVIDER_SITE_OTHER)
Admission: EM | Admit: 2012-03-07 | Discharge: 2012-03-07 | Disposition: A | Discharge status: Home / Self Care | Payer: Medicaid Other | Source: Home / Self Care | Attending: Emergency Medicine | Admitting: Emergency Medicine

## 2012-03-07 ENCOUNTER — Encounter (HOSPITAL_COMMUNITY): Payer: Self-pay | Admitting: *Deleted

## 2012-03-07 DIAGNOSIS — J329 Chronic sinusitis, unspecified: Secondary | ICD-10-CM

## 2012-03-07 DIAGNOSIS — J029 Acute pharyngitis, unspecified: Secondary | ICD-10-CM

## 2012-03-07 MED ORDER — ACETAMINOPHEN-CODEINE #3 300-30 MG PO TABS
1.0000 | ORAL_TABLET | Freq: Four times a day (QID) | ORAL | Status: AC | PRN
Start: 2012-03-07 — End: 2012-03-17

## 2012-03-07 MED ORDER — AMOXICILLIN 500 MG PO CAPS: 500.0000 mg | ORAL_CAPSULE | Freq: Three times a day (TID) | ORAL | Status: AC

## 2012-03-07 MED ORDER — FEXOFENADINE-PSEUDOEPHED ER 60-120 MG PO TB12: 1.0000 | ORAL_TABLET | Freq: Two times a day (BID) | ORAL | Status: DC

## 2012-03-07 NOTE — ED Notes (Signed)
Pt with c/o sore throat - bilateral earaches and sinus congestion x 2 days   -  Per pt difficulty swallowing due to pain - denies cough

## 2012-03-07 NOTE — ED Provider Notes (Signed)
History     CSN: [id number]  Arrival date & time 03/07/12  4098   First MD Initiated Contact with Patient 03/07/12 [phone]      Chief Complaint  Patient presents with  . Sore Throat  . Otalgia  . Nasal Congestion    (Consider location/radiation/quality/duration/timing/severity/associated sxs/prior treatment) HPI Comments: Patient presents complaining of a moderate to severe sore throat with bilateral ear pain sinus congestion for about 2 days. Denies any fevers but this morning woke up and his or discomfort when swallowing. Denies any cough, shortness of breath or fevers.  Patient is a 41 y.o. female presenting with pharyngitis and ear pain. The history is provided by the patient.  Sore Throat This is a new problem. The current episode started 2 days ago. The problem occurs constantly. The problem has been gradually worsening. Pertinent negatives include no abdominal pain, no headaches and no shortness of breath. The symptoms are aggravated by swallowing. The symptoms are relieved by nothing. She has tried nothing for the symptoms. The treatment provided no relief.  Otalgia Associated symptoms include rhinorrhea and sore throat. Pertinent negatives include no headaches, no abdominal pain, no cough and no rash.    History reviewed. No pertinent past medical history.  History reviewed. No pertinent past surgical history.  History reviewed. No pertinent family history.  History  Substance Use Topics  . Smoking status: Never Smoker   . Smokeless tobacco: Never Used  . Alcohol Use: No    OB History    Grav Para Term Preterm Abortions TAB SAB Ect Mult Living                  Review of Systems  Constitutional: Positive for appetite change. Negative for fever, chills, fatigue and unexpected weight change.  HENT: Positive for ear pain, congestion, sore throat, rhinorrhea, trouble swallowing and sinus pressure.   Respiratory: Negative for cough and shortness of breath.     Gastrointestinal: Negative for abdominal pain.  Genitourinary: Negative for dysuria.  Musculoskeletal: Negative for myalgias and arthralgias.  Skin: Negative for rash.  Neurological: Negative for headaches.    Allergies  Review of patient's allergies indicates no known allergies.  Home Medications   Current Outpatient Rx  Name Route Sig Dispense Refill  . ACETAMINOPHEN 325 MG PO TABS Oral Take 650 mg by mouth every 6 (six) hours as needed.    . IBUPROFEN 400 MG PO TABS Oral Take 400 mg by mouth every 6 (six) hours as needed.      BP 148/91  Pulse 82  Temp(Src) 98.1 F (36.7 C) (Oral)  Resp 18  SpO2 97%  LMP 02/23/2012  Physical Exam  Nursing note and vitals reviewed. Constitutional: She appears well-developed and well-nourished.  HENT:  Head: Normocephalic.  Right Ear: Tympanic membrane normal.  Left Ear: Tympanic membrane normal.  Mouth/Throat: Uvula is midline and mucous membranes are normal. Posterior oropharyngeal erythema present. No oropharyngeal exudate or tonsillar abscesses.  Eyes: Conjunctivae are normal. Right eye exhibits no discharge. Left eye exhibits no discharge. No scleral icterus.  Neck: Neck supple. No JVD present.  Cardiovascular: Normal rate.  Exam reveals no friction rub.   No murmur heard. Pulmonary/Chest: Effort normal and breath sounds normal. No respiratory distress. She has no decreased breath sounds. She has no wheezes.  Abdominal: Soft. She exhibits no distension.  Lymphadenopathy:    She has cervical adenopathy.  Skin: No rash noted.    ED Course  Procedures (including critical care time)   Labs  Reviewed  POCT RAPID STREP A ([facility] ONLY)   No results found.   No diagnosis found.    MDM   Patient with pharyngitis, rhinitis otalgia for 48 hours. No tonsillar abscesses or exudates the most likely symptoms or induce by allergies.       Jimmie Molly, MD 03/07/12 [phone]

## 2012-03-07 NOTE — Discharge Instructions (Signed)
As discussed your symptoms and exam with a negative strep test were more consistent with allergy-induced symptoms. Have encouraged you to use to these medicines (Tylenol #3 and ALLEGRA) for 2-3 days before you take antibiotic.    Pharyngitis, Viral and Bacterial Pharyngitis is soreness (inflammation) or infection of the pharynx. It is also called a sore throat. CAUSES  Most sore throats are caused by viruses and are part of a cold. However, some sore throats are caused by strep and other bacteria. Sore throats can also be caused by post nasal drip from draining sinuses, allergies and sometimes from sleeping with an open mouth. Infectious sore throats can be spread from person to person by coughing, sneezing and sharing cups or eating utensils. TREATMENT  Sore throats that are viral usually last 3-4 days. Viral illness will get better without medications (antibiotics). Strep throat and other bacterial infections will usually begin to get better about 24-48 hours after you begin to take antibiotics. HOME CARE INSTRUCTIONS   If the caregiver feels there is a bacterial infection or if there is a positive strep test, they will prescribe an antibiotic. The full course of antibiotics must be taken. If the full course of antibiotic is not taken, you or your child may become ill again. If you or your child has strep throat and do not finish all of the medication, serious heart or kidney diseases may develop.   Drink enough water and fluids to keep your urine clear or pale yellow.   Only take over-the-counter or prescription medicines for pain, discomfort or fever as directed by your caregiver.   Get lots of rest.   Gargle with salt water ( tsp. of salt in a glass of water) as often as every 1-2 hours as you need for comfort.   Hard candies may soothe the throat if individual is not at risk for choking. Throat sprays or lozenges may also be used.  SEEK MEDICAL CARE IF:   Large, tender lumps in the  neck develop.   A rash develops.   Green, yellow-brown or bloody sputum is coughed up.   Your baby is older than 3 months with a rectal temperature of 100.5 F (38.1 C) or higher for more than 1 day.  SEEK IMMEDIATE MEDICAL CARE IF:   A stiff neck develops.   You or your child are drooling or unable to swallow liquids.   You or your child are vomiting, unable to keep medications or liquids down.   You or your child has severe pain, unrelieved with recommended medications.   You or your child are having difficulty breathing (not due to stuffy nose).   You or your child are unable to fully open your mouth.   You or your child develop redness, swelling, or severe pain anywhere on the neck.   You have a fever.   Your baby is older than 3 months with a rectal temperature of 102 F (38.9 C) or higher.   Your baby is 4 months old or younger with a rectal temperature of 100.4 F (38 C) or higher.  MAKE SURE YOU:   Understand these instructions.   Will watch your condition.   Will get help right away if you are not doing well or get worse.  Document Released: 10/31/2005 Document Revised: 10/20/2011 Document Reviewed: 01/28/2008 [facility] Patient Information 2012 [place], [place].Pharyngitis, Viral and Bacterial Pharyngitis is soreness (inflammation) or infection of the pharynx. It is also called a sore throat. CAUSES  Most sore throats  are caused by viruses and are part of a cold. However, some sore throats are caused by strep and other bacteria. Sore throats can also be caused by post nasal drip from draining sinuses, allergies and sometimes from sleeping with an open mouth. Infectious sore throats can be spread from person to person by coughing, sneezing and sharing cups or eating utensils. TREATMENT  Sore throats that are viral usually last 3-4 days. Viral illness will get better without medications (antibiotics). Strep throat and other bacterial infections will usually begin to  get better about 24-48 hours after you begin to take antibiotics. HOME CARE INSTRUCTIONS   If the caregiver feels there is a bacterial infection or if there is a positive strep test, they will prescribe an antibiotic. The full course of antibiotics must be taken. If the full course of antibiotic is not taken, you or your child may become ill again. If you or your child has strep throat and do not finish all of the medication, serious heart or kidney diseases may develop.   Drink enough water and fluids to keep your urine clear or pale yellow.   Only take over-the-counter or prescription medicines for pain, discomfort or fever as directed by your caregiver.   Get lots of rest.   Gargle with salt water ( tsp. of salt in a glass of water) as often as every 1-2 hours as you need for comfort.   Hard candies may soothe the throat if individual is not at risk for choking. Throat sprays or lozenges may also be used.  SEEK MEDICAL CARE IF:   Large, tender lumps in the neck develop.   A rash develops.   Green, yellow-brown or bloody sputum is coughed up.   Your baby is older than 3 months with a rectal temperature of 100.5 F (38.1 C) or higher for more than 1 day.  SEEK IMMEDIATE MEDICAL CARE IF:   A stiff neck develops.   You or your child are drooling or unable to swallow liquids.   You or your child are vomiting, unable to keep medications or liquids down.   You or your child has severe pain, unrelieved with recommended medications.   You or your child are having difficulty breathing (not due to stuffy nose).   You or your child are unable to fully open your mouth.   You or your child develop redness, swelling, or severe pain anywhere on the neck.   You have a fever.   Your baby is older than 3 months with a rectal temperature of 102 F (38.9 C) or higher.   Your baby is 44 months old or younger with a rectal temperature of 100.4 F (38 C) or higher.  MAKE SURE YOU:    Understand these instructions.   Will watch your condition.   Will get help right away if you are not doing well or get worse.  Document Released: 10/31/2005 Document Revised: 10/20/2011 Document Reviewed: 01/28/2008 [facility] Patient Information 2012 [place], [place].Sinusitis Sinuses are air pockets within the bones of your face. The growth of bacteria within a sinus leads to infection. The infection prevents the sinuses from draining. This infection is called sinusitis. SYMPTOMS  There will be different areas of pain depending on which sinuses have become infected.  The maxillary sinuses often produce pain beneath the eyes.   Frontal sinusitis may cause pain in the middle of the forehead and above the eyes.  Other problems (symptoms) include:  Toothaches.   Colored, pus-like (purulent)  drainage from the nose.   Swelling, warmth, and tenderness over the sinus areas may be signs of infection.  TREATMENT  Sinusitis is most often determined by an exam.X-rays may be taken. If x-rays have been taken, make sure you obtain your results or find out how you are to obtain them. Your caregiver may give you medications (antibiotics). These are medications that will help kill the bacteria causing the infection. You may also be given a medication (decongestant) that helps to reduce sinus swelling.  HOME CARE INSTRUCTIONS   Only take over-the-counter or prescription medicines for pain, discomfort, or fever as directed by your caregiver.   Drink extra fluids. Fluids help thin the mucus so your sinuses can drain more easily.   Applying either moist heat or ice packs to the sinus areas may help relieve discomfort.   Use saline nasal sprays to help moisten your sinuses. The sprays can be found at your local drugstore.  SEEK IMMEDIATE MEDICAL CARE IF:  You have a fever.   You have increasing pain, severe headaches, or toothache.   You have nausea, vomiting, or drowsiness.   You develop  unusual swelling around the face or trouble seeing.  MAKE SURE YOU:   Understand these instructions.   Will watch your condition.   Will get help right away if you are not doing well or get worse.  Document Released: 10/31/2005 Document Revised: 10/20/2011 Document Reviewed: 05/30/2007 [facility] Patient Information 2012 [place], [place].

## 2012-04-26 ENCOUNTER — Encounter (HOSPITAL_COMMUNITY): Payer: Self-pay | Admitting: Emergency Medicine

## 2012-04-26 ENCOUNTER — Emergency Department (HOSPITAL_COMMUNITY)
Admission: EM | Admit: 2012-04-26 | Discharge: 2012-04-26 | Disposition: A | Discharge status: Home / Self Care | Payer: Medicaid Other | Source: Home / Self Care

## 2012-04-26 DIAGNOSIS — N760 Acute vaginitis: Secondary | ICD-10-CM

## 2012-04-26 LAB — POCT URINALYSIS DIP (DEVICE)
Ketones, ur: NEGATIVE mg/dL
Leukocytes, UA: NEGATIVE
Nitrite: NEGATIVE
Protein, ur: 30 mg/dL — AB
Urobilinogen, UA: 0.2 mg/dL (ref 0.0–1.0)
pH: 6 (ref 5.0–8.0)

## 2012-04-26 LAB — WET PREP, GENITAL

## 2012-04-26 MED ORDER — METRONIDAZOLE 500 MG PO TABS: 500.0000 mg | ORAL_TABLET | Freq: Two times a day (BID) | ORAL | Status: AC

## 2012-04-26 NOTE — ED Provider Notes (Signed)
Latoya Klein is a 41 y.o. female who presents to Urgent Care today for stringy yellow vaginal discharge present for about a week and a half.  She denies any significant abdominal or pelvic pain. Additionally she denies any dysuria urgency or frequency. Her last menstrual period was June 1. She has remote history of gonorrhea 15 years ago did develop into pelvic inflammatory disease. This does not feel like that.  She feels well otherwise   PMH reviewed. Significant for depression managed with Cymbalta History  Substance Use Topics  . Smoking status: Never Smoker   . Smokeless tobacco: Never Used  . Alcohol Use: No   ROS as above Medications reviewed. No current facility-administered medications for this encounter.   Current Outpatient Prescriptions  Medication Sig Dispense Refill  . acetaminophen (TYLENOL) 325 MG tablet Take 650 mg by mouth every 6 (six) hours as needed.      . fexofenadine-pseudoephedrine (ALLEGRA-D) 60-120 MG per tablet Take 1 tablet by mouth every 12 (twelve) hours.  30 tablet  0  . ibuprofen (ADVIL,MOTRIN) 400 MG tablet Take 400 mg by mouth every 6 (six) hours as needed.        Exam:  BP 150/90  Pulse 90  Temp 98.8 F (37.1 C) (Oral)  Resp 18  SpO2 97% Gen: Well NAD Abdominal: Normoactive bowel sounds soft nontender nondistended no masses palpated GYN: Normal external genitalia. Vaginal canal with copious white discharge. Normal cervical appearance. No cervical motion tenderness adnexal mass tenderness and normal uterus position.  No results found for this or any previous visit (from the past 24 hour(s)). No results found.  Assessment and Plan: 41 y.o. female with vaginal discharge.  Likely BV. Will treat empirically with Flagyl. Will call patient with results of tests and change treatment if needed     Rodolph Bong, MD 04/26/12 1735

## 2012-04-26 NOTE — ED Notes (Signed)
Reports vaginal discharge for 2 weeks.  Reports abdominal pain, intermittent.

## 2012-04-26 NOTE — Discharge Instructions (Signed)
Thank you for coming in today. The test results will take a while to come back.  I think you will likely need the flagyl for 1 week.  I will call you in about 1 hour.  Please make sure you have the correct number at the front desk.  We will call you about the other tests if they are positive.  Bacterial Vaginosis Bacterial vaginosis (BV) is a vaginal infection where the normal balance of bacteria in the vagina is disrupted. The normal balance is then replaced by an overgrowth of certain bacteria. There are several different kinds of bacteria that can cause BV. BV is the most common vaginal infection in women of childbearing age. CAUSES   The cause of BV is not fully understood. BV develops when there is an increase or imbalance of harmful bacteria.   Some activities or behaviors can upset the normal balance of bacteria in the vagina and put women at increased risk including:   Having a new sex partner or multiple sex partners.   Douching.   Using an intrauterine device (IUD) for contraception.   It is not clear what role sexual activity plays in the development of BV. However, women that have never had sexual intercourse are rarely infected with BV.  Women do not get BV from toilet seats, bedding, swimming pools or from touching objects around them.  SYMPTOMS   Grey vaginal discharge.   A fish-like odor with discharge, especially after sexual intercourse.   Itching or burning of the vagina and vulva.   Burning or pain with urination.   Some women have no signs or symptoms at all.  DIAGNOSIS  Your caregiver must examine the vagina for signs of BV. Your caregiver will perform lab tests and look at the sample of vaginal fluid through a microscope. They will look for bacteria and abnormal cells (clue cells), a pH test higher than 4.5, and a positive amine test all associated with BV.  RISKS AND COMPLICATIONS   Pelvic inflammatory disease (PID).   Infections following gynecology  surgery.   Developing HIV.   Developing herpes virus.  TREATMENT  Sometimes BV will clear up without treatment. However, all women with symptoms of BV should be treated to avoid complications, especially if gynecology surgery is planned. Female partners generally do not need to be treated. However, BV may spread between female sex partners so treatment is helpful in preventing a recurrence of BV.   BV may be treated with antibiotics. The antibiotics come in either pill or vaginal cream forms. Either can be used with nonpregnant or pregnant women, but the recommended dosages differ. These antibiotics are not harmful to the baby.   BV can recur after treatment. If this happens, a second round of antibiotics will often be prescribed.   Treatment is important for pregnant women. If not treated, BV can cause a premature delivery, especially for a pregnant woman who had a premature birth in the past. All pregnant women who have symptoms of BV should be checked and treated.   For chronic reoccurrence of BV, treatment with a type of prescribed gel vaginally twice a week is helpful.  HOME CARE INSTRUCTIONS   Finish all medication as directed by your caregiver.   Do not have sex until treatment is completed.   Tell your sexual partner that you have a vaginal infection. They should see their caregiver and be treated if they have problems, such as a mild rash or itching.   Practice safe sex.  Use condoms. Only have 1 sex partner.  PREVENTION  Basic prevention steps can help reduce the risk of upsetting the natural balance of bacteria in the vagina and developing BV:  Do not have sexual intercourse (be abstinent).   Do not douche.   Use all of the medicine prescribed for treatment of BV, even if the signs and symptoms go away.   Tell your sex partner if you have BV. That way, they can be treated, if needed, to prevent reoccurrence.  SEEK MEDICAL CARE IF:   Your symptoms are not improving after 3  days of treatment.   You have increased discharge, pain, or fever.  MAKE SURE YOU:   Understand these instructions.   Will watch your condition.   Will get help right away if you are not doing well or get worse.  FOR MORE INFORMATION  Division of STD Prevention (DSTDP), Centers for Disease Control and Prevention: [employer] American Social Health Association (ASHA): www.ashastd.org  Document Released: 10/31/2005 Document Revised: 10/20/2011 Document Reviewed: 04/23/2009 [facility] Patient Information 2012 [place], [place].

## 2012-04-27 LAB — HIV ANTIBODY (ROUTINE TESTING W REFLEX): HIV: NONREACTIVE

## 2012-04-27 LAB — GC/CHLAMYDIA PROBE AMP, GENITAL: Chlamydia, DNA Probe: NEGATIVE

## 2012-04-28 NOTE — ED Provider Notes (Signed)
Medical screening examination/treatment/procedure(s) were performed by PGY-3 [facility] resident and as supervising physician I was immediately available for consultation/collaboration.   Sharin Grave, MD   Sharin Grave, MD 04/28/12 1245

## 2012-06-11 ENCOUNTER — Ambulatory Visit: Payer: Medicaid Other | Admitting: Obstetrics & Gynecology

## 2012-06-13 ENCOUNTER — Encounter: Payer: Self-pay | Admitting: Obstetrics & Gynecology

## 2012-07-09 ENCOUNTER — Ambulatory Visit (INDEPENDENT_AMBULATORY_CARE_PROVIDER_SITE_OTHER): Payer: Medicaid Other | Admitting: Advanced Practice Midwife

## 2012-07-09 ENCOUNTER — Encounter: Payer: Self-pay | Admitting: Advanced Practice Midwife

## 2012-07-09 VITALS — BP 123/80 | HR 82 | Temp 97.7°F | Ht 63.25 in | Wt 193.0 lb

## 2012-07-09 DIAGNOSIS — N765 Ulceration of vagina: Secondary | ICD-10-CM | POA: Insufficient documentation

## 2012-07-09 DIAGNOSIS — N7689 Other specified inflammation of vagina and vulva: Secondary | ICD-10-CM

## 2012-07-09 MED ORDER — VALACYCLOVIR HCL 1 G PO TABS: 1000.0000 mg | ORAL_TABLET | Freq: Every day | ORAL | Status: DC

## 2012-07-09 MED ORDER — VALACYCLOVIR HCL 500 MG PO TABS: 500.0000 mg | ORAL_TABLET | Freq: Two times a day (BID) | ORAL | Status: DC

## 2012-07-09 NOTE — Patient Instructions (Signed)
Genital Herpes Genital herpes is a sexually transmitted disease. This means that it is a disease passed by having sex with an infected person. There is no cure for genital herpes. The time between attacks can be months to years. The virus may live in a person but produce no problems (symptoms). This infection can be passed to a baby as it travels down the birth canal (vagina). In a newborn, this can cause central nervous system damage, eye damage, or even death. The virus that causes genital herpes is usually HSV-2 virus. The virus that causes oral herpes is usually HSV-1. The diagnosis (learning what is wrong) is made through culture results. SYMPTOMS  Usually symptoms of pain and itching begin a few days to a week after contact. It first appears as small blisters that progress to small painful ulcers which then scab over and heal after several days. It affects the outer genitalia, birth canal, cervix, penis, anal area, buttocks, and thighs. HOME CARE INSTRUCTIONS   Keep ulcerated areas dry and clean.   Take medications as directed. Antiviral medications can speed up healing. They will not prevent recurrences or cure this infection. These medications can also be taken for suppression if there are frequent recurrences.   While the infection is active, it is contagious. Avoid all sexual contact during active infections.   Condoms may help prevent spread of the herpes virus.   Practice safe sex.   Wash your hands thoroughly after touching the genital area.   Avoid touching your eyes after touching your genital area.   Inform your caregiver if you have had genital herpes and become pregnant. It is your responsibility to insure a safe outcome for your baby in this pregnancy.   Only take over-the-counter or prescription medicines for pain, discomfort, or fever as directed by your caregiver.  SEEK MEDICAL CARE IF:   You have a recurrence of this infection.   You do not respond to medications and  are not improving.   You have new sources of pain or discharge which have changed from the original infection.   You have an oral temperature above 102 F (38.9 C).   You develop abdominal pain.   You develop eye pain or signs of eye infection.  Document Released: 10/28/2000 Document Revised: 10/20/2011 Document Reviewed: 11/18/2009 [employer] Patient Information 2012 [employer]. 

## 2012-07-09 NOTE — Addendum Note (Signed)
Addended by: Franchot Mimes on: 07/09/2012 02:14 PM   Modules accepted: Orders

## 2012-07-09 NOTE — Progress Notes (Signed)
  Subjective:    Patient ID: Latoya Klein, female    DOB: 12-24-1970, 41 y.o.   MRN: [id number]  HPI This is a 41 y.o. female who presents with c/o recurrent labial (vaginal) lesion ("sore") which comes almost every month with her period. States has been tested for herpes, but chart review shows no testing. States has been happening for about a year. Has some prodrome before outbreaks.    Review of Systems As listed in HPI     Objective:   Physical Exam  Constitutional: She is oriented to person, place, and time. She appears well-developed and well-nourished. No distress.  Pulmonary/Chest: Effort normal.  Abdominal: Soft. She exhibits no distension. There is no tenderness.  Genitourinary: Uterus normal. Vaginal discharge (mucous white) found.       Left labia majora has a 5mm ulcerated lesion weeping clear fluid and just adjacent is a 1mm pustule.  HSV cultures taken  Musculoskeletal: Normal range of motion.  Neurological: She is alert and oriented to person, place, and time.  Skin: Skin is warm and dry.  Psychiatric: She has a normal mood and affect.   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       Will treat wet prep when resulted if needed

## 2012-07-10 LAB — WET PREP, GENITAL
Clue Cells Wet Prep HPF POC: NONE SEEN
Trich, Wet Prep: NONE SEEN

## 2012-07-11 ENCOUNTER — Telehealth: Payer: Self-pay | Admitting: *Deleted

## 2012-07-11 NOTE — Telephone Encounter (Signed)
I called patient and left her a message to return our call. See message from [place] below.

## 2012-07-11 NOTE — Telephone Encounter (Signed)
Message copied by Mannie Stabile on Wed Jul 11, 2012  4:35 PM ------      Message from: [place], [place] L      Created: Wed Jul 11, 2012  2:42 PM      Regarding: HSV result       Can you call patient with her result?            We cultured a lesion on her labia for HSV.  It came back positive.  She already has [facility] Valtrex Rx.

## 2012-07-12 NOTE — Telephone Encounter (Signed)
Called [facility] and let her know the herpes culture came back postive. Rivers states she has already started the valtrex and it is helping, states they discussed safe sex practices and denies any question.

## 2012-07-13 ENCOUNTER — Telehealth (HOSPITAL_COMMUNITY): Payer: Self-pay | Admitting: *Deleted

## 2012-07-13 NOTE — ED Notes (Signed)
Pt. came to [facility] and asked for her lab results. Pt. was here 6/13.  Pt. verified with picture ID and shown her lab results. (GC/Chlamydia neg., Wet prep: few clue cells, few WBC's, HIV/RPR non-reactive)  Pt. instructed to get HIV rechecked in 6 mos. Pt. had other tests done on 8/26 at the [facility]. 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 Pt. instructed to notify her partner. You can pass the virus even when you don't have an outbreak, so always practice safe sex. Get treated for each outbreak with Acyclovir or Valtrex. Pt. instructed to get a PCP or OB-GYN doctor that can treat her for future outbreaks. Pt. voiced understanding

## 2012-11-23 ENCOUNTER — Emergency Department (HOSPITAL_COMMUNITY)
Admission: EM | Admit: 2012-11-23 | Discharge: 2012-11-23 | Disposition: A | Discharge status: Home / Self Care | Payer: Medicaid Other | Attending: Emergency Medicine | Admitting: Emergency Medicine

## 2012-11-23 DIAGNOSIS — D1779 Benign lipomatous neoplasm of other sites: Secondary | ICD-10-CM | POA: Insufficient documentation

## 2012-11-23 DIAGNOSIS — F17200 Nicotine dependence, unspecified, uncomplicated: Secondary | ICD-10-CM | POA: Insufficient documentation

## 2012-11-23 DIAGNOSIS — D171 Benign lipomatous neoplasm of skin and subcutaneous tissue of trunk: Secondary | ICD-10-CM

## 2012-11-23 MED ORDER — MELOXICAM 15 MG PO TABS: 7.5000 mg | ORAL_TABLET | Freq: Every day | ORAL | Status: DC

## 2012-11-23 NOTE — ED Notes (Signed)
Pt states that she is having R flank pain.  She states that the pain has been present for the last 2 months.  She reports having been treated for UTI x 2 (last one 1 month ago).  Denies any increase in pain with movement.  Does state that motrin decreases the pain.  No pain on urination.

## 2012-11-23 NOTE — ED Provider Notes (Signed)
History     CSN: [id number]  Arrival date & time 11/23/12  4098   First MD Initiated Contact with Patient 11/23/12 0901      Chief Complaint  Patient presents with  . Flank Pain    (Consider location/radiation/quality/duration/timing/severity/associated sxs/prior treatment) HPI For 42-year-old female presents to the emergency department with chief complaint of right side pain x2 months.  The patient has a "knot" in her side that she able to palpate.  She states that it is inflamed and she has to take Advil for the pain.  She has used Advil daily for the past 2 months.  Patient has seen her primary care physician and [place] care and treated for urinary tract infection twice.  Patient denies any urinary symptoms.  Patient denies that pain is worse with movement or breathing. It is tender to palpation. Denies HX zoster infection.The patient has no other complaints. No past medical history on file.  No past surgical history on file.  Family History  Problem Relation Age of Onset  . Diabetes Mother   . Cancer Father     colon    History  Substance Use Topics  . Smoking status: Current Every Day Smoker  . Smokeless tobacco: Never Used  . Alcohol Use: No    OB History    Grav Para Term Preterm Abortions TAB SAB Ect Mult Living   5 4 4  0 1 0 0 0 0 4      Review of Systems Ten systems reviewed and are negative for acute change, except as noted in the HPI.   Allergies  Review of patient's allergies indicates no known allergies.  Home Medications   Current Outpatient Rx  Name  Route  Sig  Dispense  Refill  . VALACYCLOVIR HCL 1 G PO TABS   Oral   Take 1,000 mg by mouth daily as needed. For flareup           BP 125/80  Pulse 106  Temp 99.1 F (37.3 C) (Oral)  Resp 20  SpO2 97%  Physical Exam Physical Exam  Nursing note and vitals reviewed. Constitutional: She is oriented to person, place, and time. She appears well-developed and well-nourished. No  distress.  HENT:  Head: Normocephalic and atraumatic.  Eyes: Conjunctivae normal and EOM are normal. Pupils are equal, round, and reactive to light. No scleral icterus.  Neck: Normal range of motion.  Cardiovascular: Normal rate, regular rhythm and normal heart sounds.  Exam reveals no gallop and no friction rub.   No murmur heard. Pulmonary/Chest: Effort normal and breath sounds normal. No respiratory distress.  Abdominal: Soft. Bowel sounds are normal. She exhibits no distension and no mass. There is no tenderness. There is no guarding.  Neurological: She is alert and oriented to person, place, and time.  Skin: Skin is warm and dry. She is not diaphoretic. There is a 1 cm palpable, movable mass in the right side approx 3 cm abve the middle illiac wing.    ED Course  Procedures (including critical care time)  Labs Reviewed - No data to display No results found.   1. Lipoma of abdominal wall       MDM  9:07 AM BP 125/80  Pulse 106  Temp 99.1 F (37.3 C) (Oral)  Resp 20  SpO2 97% I believe that this is a lipoma. That is irritatted.  Bedside US revelas no inflammation. Fat stranding, focal abscess or tissue differentiation of any sort. The patient has no complainst  of UTI or abdominal pain.no vaginal sxs.Pain is focal and localized to the nodule. No muscular pain on palpation and back exam shows FROM. At this time there does not appear to be any evidence of an acute emergency medical condition and the patient appears stable for discharge with appropriate outpatient follow up.Diagnosis was discussed with patient who verbalizes understanding and is agreeable to discharge. Pt case discussed with Dr. Manus Gunning who agrees with my plan.         Arthor Captain, PA-C 11/23/12 1020

## 2012-11-23 NOTE — ED Provider Notes (Signed)
Medical screening examination/treatment/procedure(s) were performed by non-physician practitioner and as supervising physician I was immediately available for consultation/collaboration.   Glynn Octave, MD 11/23/12 [phone]

## 2013-01-07 ENCOUNTER — Other Ambulatory Visit (HOSPITAL_COMMUNITY): Payer: Self-pay | Admitting: Nurse Practitioner

## 2013-02-06 ENCOUNTER — Ambulatory Visit (HOSPITAL_COMMUNITY): Payer: Self-pay

## 2013-02-19 ENCOUNTER — Ambulatory Visit (HOSPITAL_COMMUNITY)
Admission: RE | Admit: 2013-02-19 | Discharge: 2013-02-19 | Disposition: A | Discharge status: Home / Self Care | Payer: Medicaid Other | Source: Ambulatory Visit | Attending: Nurse Practitioner | Admitting: Nurse Practitioner

## 2013-02-19 DIAGNOSIS — Z1231 Encounter for screening mammogram for malignant neoplasm of breast: Secondary | ICD-10-CM | POA: Insufficient documentation

## 2013-09-30 ENCOUNTER — Emergency Department (HOSPITAL_COMMUNITY): Payer: Medicaid Other

## 2013-09-30 ENCOUNTER — Emergency Department (HOSPITAL_COMMUNITY)
Admission: EM | Admit: 2013-09-30 | Discharge: 2013-09-30 | Disposition: A | Discharge status: Home / Self Care | Payer: Medicaid Other | Attending: Emergency Medicine | Admitting: Emergency Medicine

## 2013-09-30 ENCOUNTER — Encounter (HOSPITAL_COMMUNITY): Payer: Self-pay | Admitting: Emergency Medicine

## 2013-09-30 DIAGNOSIS — Z8619 Personal history of other infectious and parasitic diseases: Secondary | ICD-10-CM | POA: Insufficient documentation

## 2013-09-30 DIAGNOSIS — Z79899 Other long term (current) drug therapy: Secondary | ICD-10-CM | POA: Insufficient documentation

## 2013-09-30 DIAGNOSIS — K219 Gastro-esophageal reflux disease without esophagitis: Secondary | ICD-10-CM

## 2013-09-30 DIAGNOSIS — Z3202 Encounter for pregnancy test, result negative: Secondary | ICD-10-CM | POA: Insufficient documentation

## 2013-09-30 DIAGNOSIS — F17200 Nicotine dependence, unspecified, uncomplicated: Secondary | ICD-10-CM | POA: Insufficient documentation

## 2013-09-30 HISTORY — DX: Unspecified viral hepatitis C without hepatic coma: B19.20

## 2013-09-30 LAB — CBC
HCT: 32.8 % — ABNORMAL LOW (ref 36.0–46.0)
Hemoglobin: 11.4 g/dL — ABNORMAL LOW (ref 12.0–15.0)
MCH: 27.4 pg (ref 26.0–34.0)
MCHC: 34.8 g/dL (ref 30.0–36.0)
MCV: 78.8 fL (ref 78.0–100.0)
Platelets: 397 K/uL (ref 150–400)
RBC: 4.16 MIL/uL (ref 3.87–5.11)
RDW: 16.5 % — ABNORMAL HIGH (ref 11.5–15.5)
WBC: 8.6 10*3/uL (ref 4.0–10.5)

## 2013-09-30 LAB — BASIC METABOLIC PANEL
BUN: 11 mg/dL (ref 6–23)
CO2: 28 mEq/L (ref 19–32)
Calcium: 9.4 mg/dL (ref 8.4–10.5)
Chloride: 102 mEq/L (ref 96–112)
Creatinine, Ser: 0.93 mg/dL (ref 0.50–1.10)
GFR calc Af Amer: 86 mL/min — ABNORMAL LOW (ref >90)
GFR calc non Af Amer: 74 mL/min — ABNORMAL LOW (ref >90)

## 2013-09-30 LAB — BASIC METABOLIC PANEL WITH GFR
Glucose, Bld: 109 mg/dL — ABNORMAL HIGH (ref 70–99)
Potassium: 3.7 meq/L (ref 3.5–5.1)
Sodium: 138 meq/L (ref 135–145)

## 2013-09-30 LAB — POCT I-STAT TROPONIN I: Troponin i, poc: 0 ng/mL (ref 0.00–0.08)

## 2013-09-30 LAB — POCT PREGNANCY, URINE: Preg Test, Ur: NEGATIVE

## 2013-09-30 MED ORDER — PANTOPRAZOLE SODIUM 40 MG PO TBEC: 40.0000 mg | DELAYED_RELEASE_TABLET | Freq: Every day | ORAL | Status: AC

## 2013-09-30 MED ORDER — GI COCKTAIL ~~LOC~~
30.0000 mL | Freq: Once | ORAL | Status: AC
  Administered 2013-09-30: 30 mL via ORAL
  Filled 2013-09-30: qty 30

## 2013-09-30 NOTE — ED Notes (Signed)
Pt states started having chest pain one week ago and now with pain to left chest and down left arm with development of sob

## 2013-09-30 NOTE — ED Notes (Signed)
Patient transported to X-ray 

## 2013-09-30 NOTE — ED Provider Notes (Signed)
CSN: [id number]     Arrival date & time 09/30/13  4098 History   First MD Initiated Contact with Patient 09/30/13 1025     Chief Complaint  Patient presents with  . Chest Pain   (Consider location/radiation/quality/duration/timing/severity/associated sxs/prior Treatment) HPI Comments: Pt reports intermittent sharp pain mostly in left arm and shoulder and anterior chest for 1 week, lasting about 1 minute.  Today, more of a pressure sensation substernal without much radiation since last night, eating didn't worsen symptoms, no sweats, dizziness, nausea.    Patient is a 42 y.o. female presenting with chest pain. The history is provided by the patient.  Chest Pain Pain location:  Substernal area Pain quality: pressure   Pain radiates to the back: no   Context: at rest   Context: not eating, no movement, not raising an arm, no stress and no trauma   Associated symptoms: no abdominal pain, no anxiety, no cough, no diaphoresis, no fever, no heartburn, no nausea and not vomiting   Risk factors: smoking   Risk factors: no coronary artery disease, no diabetes mellitus, no hypertension, not female and no surgery     Past Medical History  Diagnosis Date  . Hepatitis C    History reviewed. No pertinent past surgical history. Family History  Problem Relation Age of Onset  . Diabetes Mother   . Cancer Father     colon   History  Substance Use Topics  . Smoking status: Current Every Day Smoker  . Smokeless tobacco: Never Used  . Alcohol Use: No   OB History   Grav Para Term Preterm Abortions TAB SAB Ect Mult Living   5 4 4  0 1 0 0 0 0 4     Review of Systems  Constitutional: Negative for fever and diaphoresis.  Respiratory: Negative for cough.   Cardiovascular: Positive for chest pain.  Gastrointestinal: Negative for heartburn, nausea, vomiting and abdominal pain.  All other systems reviewed and are negative.    Allergies  Review of patient's allergies indicates no known  allergies.  Home Medications   Current Outpatient Rx  Name  Route  Sig  Dispense  Refill  . Multiple Vitamins-Minerals (MULTIVITAMIN WITH MINERALS) tablet   Oral   Take 1 tablet by mouth daily.         . valACYclovir (VALTREX) 500 MG tablet   Oral   Take 500 mg by mouth 2 (two) times daily as needed (for outbreaks).         . vitamin E 100 UNIT capsule   Oral   Take 100 Units by mouth daily.         . pantoprazole (PROTONIX) 40 MG tablet   Oral   Take 1 tablet (40 mg total) by mouth daily.   14 tablet   0    BP 125/88  Pulse 75  Temp(Src) 98.6 F (37 C) (Oral)  Resp 18  Wt 201 lb 3 oz (91.258 kg)  SpO2 98%  LMP 09/25/2013 Physical Exam  Nursing note and vitals reviewed. Constitutional: She is oriented to person, place, and time. She appears well-developed and well-nourished.  HENT:  Head: Normocephalic and atraumatic.  Eyes: Conjunctivae and EOM are normal.  Neck: Neck supple. No JVD present.  Cardiovascular: Normal rate and intact distal pulses.   No murmur heard. Pulmonary/Chest: Effort normal. No respiratory distress. She has no wheezes.  Abdominal: Soft. She exhibits no distension. There is no tenderness. There is no rebound.  Musculoskeletal: She exhibits no edema.  Neurological: She is alert and oriented to person, place, and time. She exhibits normal muscle tone. Coordination normal.  Skin: Skin is warm and dry.  Psychiatric: She has a normal mood and affect.    ED Course  Procedures (including critical care time) Labs Review Labs Reviewed  CBC - Abnormal; Notable for the following:    Hemoglobin 11.4 (*)    HCT 32.8 (*)    RDW 16.5 (*)    All other components within normal limits  BASIC METABOLIC PANEL - Abnormal; Notable for the following:    Glucose, Bld 109 (*)    GFR calc non Af Amer 74 (*)    GFR calc Af Amer 86 (*)    All other components within normal limits  POCT I-STAT TROPONIN I  POCT PREGNANCY, URINE   Imaging Review Dg  Chest 2 View  09/30/2013   CLINICAL DATA:  Chest pain.  EXAM: CHEST  2 VIEW  COMPARISON:  09/30/2010  FINDINGS: The heart size and mediastinal contours are within normal limits. Both lungs are clear. The visualized skeletal structures are unremarkable.  IMPRESSION: No active cardiopulmonary disease.   Electronically Signed   By: Salome Holmes M.D.   On: 09/30/2013 10:39    EKG Interpretation    Date/Time:  Monday September 30 2013 09:54:04 EST Ventricular Rate:  89 PR Interval:  144 QRS Duration: 80 QT Interval:  388 QTC Calculation: 472 R Axis:   60 Text Interpretation:  Normal sinus rhythm Nonspecific T wave abnormality Prolonged QT Abnormal ECG No significant change since last tracing           RA sat is 98% and I interpret to be normal  MDM   1. GERD (gastroesophageal reflux disease)    Pt's pain is atypical, no classic ACS symptoms, not worse with exertion, has stayed rather constant for several hours and pt appears very well clinically.  After GI cocktail, pt had complete resolution of symptoms and has remained symptom free for 1.5 hours afterwards.  ECG is unchanged.      Gavin Pound. Jisel Fleet, MD 09/30/13 1440

## 2014-04-18 ENCOUNTER — Encounter (HOSPITAL_COMMUNITY): Payer: Self-pay | Admitting: Emergency Medicine

## 2014-04-18 ENCOUNTER — Emergency Department (HOSPITAL_COMMUNITY)
Admission: EM | Admit: 2014-04-18 | Discharge: 2014-04-18 | Disposition: A | Discharge status: Home / Self Care | Payer: Medicaid Other | Source: Home / Self Care

## 2014-04-18 DIAGNOSIS — J309 Allergic rhinitis, unspecified: Secondary | ICD-10-CM

## 2014-04-18 DIAGNOSIS — J40 Bronchitis, not specified as acute or chronic: Secondary | ICD-10-CM

## 2014-04-18 DIAGNOSIS — F17200 Nicotine dependence, unspecified, uncomplicated: Secondary | ICD-10-CM

## 2014-04-18 DIAGNOSIS — J45909 Unspecified asthma, uncomplicated: Secondary | ICD-10-CM

## 2014-04-18 MED ORDER — ALBUTEROL SULFATE (2.5 MG/3ML) 0.083% IN NEBU
2.5000 mg | INHALATION_SOLUTION | Freq: Once | RESPIRATORY_TRACT | Status: AC
  Administered 2014-04-18: 2.5 mg via RESPIRATORY_TRACT

## 2014-04-18 MED ORDER — TRIAMCINOLONE ACETONIDE 40 MG/ML IJ SUSP
40.0000 mg | Freq: Once | INTRAMUSCULAR | Status: AC
  Administered 2014-04-18: 40 mg via INTRAMUSCULAR

## 2014-04-18 MED ORDER — METHYLPREDNISOLONE 4 MG PO KIT: PACK | ORAL | Status: DC

## 2014-04-18 MED ORDER — TRIAMCINOLONE ACETONIDE 40 MG/ML IJ SUSP
INTRAMUSCULAR | Status: AC
  Filled 2014-04-18: qty 1

## 2014-04-18 MED ORDER — ALBUTEROL SULFATE (2.5 MG/3ML) 0.083% IN NEBU
INHALATION_SOLUTION | RESPIRATORY_TRACT | Status: AC
  Filled 2014-04-18: qty 3

## 2014-04-18 MED ORDER — IPRATROPIUM-ALBUTEROL 0.5-2.5 (3) MG/3ML IN SOLN
RESPIRATORY_TRACT | Status: AC
  Filled 2014-04-18: qty 3

## 2014-04-18 MED ORDER — IPRATROPIUM-ALBUTEROL 0.5-2.5 (3) MG/3ML IN SOLN
3.0000 mL | Freq: Once | RESPIRATORY_TRACT | Status: AC
  Administered 2014-04-18: 3 mL via RESPIRATORY_TRACT

## 2014-04-18 MED ORDER — ALBUTEROL SULFATE HFA 108 (90 BASE) MCG/ACT IN AERS: 2.0000 | INHALATION_SPRAY | RESPIRATORY_TRACT | Status: DC | PRN

## 2014-04-18 NOTE — ED Provider Notes (Signed)
Medical screening examination/treatment/procedure(s) were performed by resident physician or non-physician practitioner and as supervising physician I was immediately available for consultation/collaboration.   Pauline Good MD.   Billy Fischer, MD 04/18/14 1149

## 2014-04-18 NOTE — Discharge Instructions (Signed)
Allergic Rhinitis Sudafed PE for congestion Allegra or claritin for drainage flonase nasal spray Lots of saline nasal spray  Drink lots of water Robitussin DM Allergic rhinitis is when the mucous membranes in the nose respond to allergens. Allergens are particles in the air that cause your body to have an allergic reaction. This causes you to release allergic antibodies. Through a chain of events, these eventually cause you to release histamine into the blood stream. Although meant to protect the body, it is this release of histamine that causes your discomfort, such as frequent sneezing, congestion, and an itchy, runny nose.  CAUSES  Seasonal allergic rhinitis (hay fever) is caused by pollen allergens that may come from grasses, trees, and weeds. Year-round allergic rhinitis (perennial allergic rhinitis) is caused by allergens such as house dust mites, pet dander, and mold spores.  SYMPTOMS   Nasal stuffiness (congestion).  Itchy, runny nose with sneezing and tearing of the eyes. DIAGNOSIS  Your health care provider can help you determine the allergen or allergens that trigger your symptoms. If you and your health care provider are unable to determine the allergen, skin or blood testing may be used. TREATMENT  Allergic Rhinitis does not have a cure, but it can be controlled by:  Medicines and allergy shots (immunotherapy).  Avoiding the allergen. Hay fever may often be treated with antihistamines in pill or nasal spray forms. Antihistamines block the effects of histamine. There are over-the-counter medicines that may help with nasal congestion and swelling around the eyes. Check with your health care provider before taking or giving this medicine.  If avoiding the allergen or the medicine prescribed do not work, there are many new medicines your health care provider can prescribe. Stronger medicine may be used if initial measures are ineffective. Desensitizing injections can be used if  medicine and avoidance does not work. Desensitization is when a patient is given ongoing shots until the body becomes less sensitive to the allergen. Make sure you follow up with your health care provider if problems continue. HOME CARE INSTRUCTIONS It is not possible to completely avoid allergens, but you can reduce your symptoms by taking steps to limit your exposure to them. It helps to know exactly what you are allergic to so that you can avoid your specific triggers. SEEK MEDICAL CARE IF:   You have a fever.  You develop a cough that does not stop easily (persistent).  You have shortness of breath.  You start wheezing.  Symptoms interfere with normal daily activities. Document Released: 07/26/2001 Document Revised: 08/21/2013 Document Reviewed: 07/08/2013 [facility] Patient Information 2014 [place].  Bronchitis Bronchitis is inflammation of the airways that extend from the windpipe into the lungs (bronchi). The inflammation often causes mucus to develop, which leads to a cough. If the inflammation becomes severe, it may cause shortness of breath. CAUSES  Bronchitis may be caused by:   Viral infections.   Bacteria.   Cigarette smoke.   Allergens, pollutants, and other irritants.  SIGNS AND SYMPTOMS  The most common symptom of bronchitis is a frequent cough that produces mucus. Other symptoms include:  Fever.   Body aches.   Chest congestion.   Chills.   Shortness of breath.   Sore throat.  DIAGNOSIS  Bronchitis is usually diagnosed through a medical history and physical exam. Tests, such as chest X-rays, are sometimes done to rule out other conditions.  TREATMENT  You may need to avoid contact with whatever caused the problem (smoking, for example). Medicines are  sometimes needed. These may include:  Antibiotics. These may be prescribed if the condition is caused by bacteria.  Cough suppressants. These may be prescribed for relief of cough  symptoms.   Inhaled medicines. These may be prescribed to help open your airways and make it easier for you to breathe.   Steroid medicines. These may be prescribed for those with recurrent (chronic) bronchitis. HOME CARE INSTRUCTIONS  Get plenty of rest.   Drink enough fluids to keep your urine clear or pale yellow (unless you have a medical condition that requires fluid restriction). Increasing fluids may help thin your secretions and will prevent dehydration.   Only take over-the-counter or prescription medicines as directed by your health care provider.  Only take antibiotics as directed. Make sure you finish them even if you start to feel better.  Avoid secondhand smoke, irritating chemicals, and strong fumes. These will make bronchitis worse. If you are a smoker, quit smoking. Consider using nicotine gum or skin patches to help control withdrawal symptoms. Quitting smoking will help your lungs heal faster.   Put a cool-mist humidifier in your bedroom at night to moisten the air. This may help loosen mucus. Change the water in the humidifier daily. You can also run the hot water in your shower and sit in the bathroom with the door closed for 5 10 minutes.   Follow up with your health care provider as directed.   Wash your hands frequently to avoid catching bronchitis again or spreading an infection to others.  SEEK MEDICAL CARE IF: Your symptoms do not improve after 1 week of treatment.  SEEK IMMEDIATE MEDICAL CARE IF:  Your fever increases.  You have chills.   You have chest pain.   You have worsening shortness of breath.   You have bloody sputum.  You faint.  You have lightheadedness.  You have a severe headache.   You vomit repeatedly. MAKE SURE YOU:   Understand these instructions.  Will watch your condition.  Will get help right away if you are not doing well or get worse. Document Released: 10/31/2005 Document Revised: 08/21/2013 Document  Reviewed: 06/25/2013 [facility] Patient Information 2014 [place].  Bronchospasm, Adult A bronchospasm is a spasm or tightening of the airways going into the lungs. During a bronchospasm breathing becomes more difficult because the airways get smaller. When this happens there can be coughing, a whistling sound when breathing (wheezing), and difficulty breathing. Bronchospasm is often associated with asthma, but not all patients who experience a bronchospasm have asthma. CAUSES  A bronchospasm is caused by inflammation or irritation of the airways. The inflammation or irritation may be triggered by:   Allergies (such as to animals, pollen, food, or mold). Allergens that cause bronchospasm may cause wheezing immediately after exposure or many hours later.   Infection. Viral infections are believed to be the most common cause of bronchospasm.   Exercise.   Irritants (such as pollution, cigarette smoke, strong odors, aerosol sprays, and paint fumes).   Weather changes. Winds increase molds and pollens in the air. Rain refreshes the air by washing irritants out. Cold air may cause inflammation.   Stress and emotional upset.  SIGNS AND SYMPTOMS   Wheezing.   Excessive nighttime coughing.   Frequent or severe coughing with a simple cold.   Chest tightness.   Shortness of breath.  DIAGNOSIS  Bronchospasm is usually diagnosed through a history and physical exam. Tests, such as chest X-rays, are sometimes done to look for other conditions. TREATMENT  Inhaled medicines can be given to open up your airways and help you breathe. The medicines can be given using either an inhaler or a nebulizer machine.  Corticosteroid medicines may be given for severe bronchospasm, usually when it is associated with asthma. HOME CARE INSTRUCTIONS   Always have a plan prepared for seeking medical care. Know when to call your health care provider and local emergency services (911 in the  [place] Know where you can access local emergency care.  Only take medicines as directed by your health care provider.  If you were prescribed an inhaler or nebulizer machine, ask your health care provider to explain how to use it correctly. Always use a spacer with your inhaler if you were given one.  It is necessary to remain calm during an attack. Try to relax and breathe more slowly.  Control your home environment in the following ways:   Change your heating and air conditioning filter at least once a month.   Limit your use of fireplaces and wood stoves.  Do not smoke and do not allow smoking in your home.   Avoid exposure to perfumes and fragrances.   Get rid of pests (such as roaches and mice) and their droppings.   Throw away plants if you see mold on them.   Keep your house clean and dust free.   Replace carpet with wood, tile, or vinyl flooring. Carpet can trap dander and dust.   Use allergy-proof pillows, mattress covers, and box spring covers.   Wash bed sheets and blankets every week in hot water and dry them in a dryer.   Use blankets that are made of polyester or cotton.   Wash hands frequently. SEEK MEDICAL CARE IF:   You have muscle aches.   You have chest pain.   The sputum changes from clear or white to yellow, green, gray, or bloody.   The sputum you cough up gets thicker.   There are problems that may be related to the medicine you are given, such as a rash, itching, swelling, or trouble breathing.  SEEK IMMEDIATE MEDICAL CARE IF:   You have worsening wheezing and coughing even after taking your prescribed medicines.   You have increased difficulty breathing.   You develop severe chest pain. MAKE SURE YOU:   Understand these instructions.  Will watch your condition.  Will get help right away if you are not doing well or get worse. Document Released: 11/03/2003 Document Revised: 07/03/2013 Document Reviewed:  04/22/2013 [facility] Patient Information 2014 [place].  Smoking Cessation Quitting smoking is important to your health and has many advantages. However, it is not always easy to quit since nicotine is a very addictive drug. Often times, people try 3 times or more before being able to quit. This document explains the best ways for you to prepare to quit smoking. Quitting takes hard work and a lot of effort, but you can do it. ADVANTAGES OF QUITTING SMOKING  You will live longer, feel better, and live better.  Your body will feel the impact of quitting smoking almost immediately.  Within 20 minutes, blood pressure decreases. Your pulse returns to its normal level.  After 8 hours, carbon monoxide levels in the blood return to normal. Your oxygen level increases.  After 24 hours, the chance of having a heart attack starts to decrease. Your breath, hair, and body stop smelling like smoke.  After 48 hours, damaged nerve endings begin to recover. Your sense of taste and smell improve.  After 72 hours, the body is virtually free of nicotine. Your bronchial tubes relax and breathing becomes easier.  After 2 to 12 weeks, lungs can hold more air. Exercise becomes easier and circulation improves.  The risk of having a heart attack, stroke, cancer, or lung disease is greatly reduced.  After 1 year, the risk of coronary heart disease is cut in half.  After 5 years, the risk of stroke falls to the same as a nonsmoker.  After 10 years, the risk of lung cancer is cut in half and the risk of other cancers decreases significantly.  After 15 years, the risk of coronary heart disease drops, usually to the level of a nonsmoker.  If you are pregnant, quitting smoking will improve your chances of having a healthy baby.  The people you live with, especially any children, will be healthier.  You will have extra money to spend on things other than cigarettes. QUESTIONS TO THINK ABOUT BEFORE  ATTEMPTING TO QUIT You may want to talk about your answers with your caregiver.  Why do you want to quit?  If you tried to quit in the past, what helped and what did not?  What will be the most difficult situations for you after you quit? How will you plan to handle them?  Who can help you through the tough times? Your family? Friends? A caregiver?  What pleasures do you get from smoking? What ways can you still get pleasure if you quit? Here are some questions to ask your caregiver:  How can you help me to be successful at quitting?  What medicine do you think would be best for me and how should I take it?  What should I do if I need more help?  What is smoking withdrawal like? How can I get information on withdrawal? GET READY  Set a quit date.  Change your environment by getting rid of all cigarettes, ashtrays, matches, and lighters in your home, car, or work. Do not let people smoke in your home.  Review your past attempts to quit. Think about what worked and what did not. GET SUPPORT AND ENCOURAGEMENT You have a better chance of being successful if you have help. You can get support in many ways.  Tell your family, friends, and co-workers that you are going to quit and need their support. Ask them not to smoke around you.  Get individual, group, or telephone counseling and support. Programs are available at [employer] and health centers. Call your local health department for information about programs in your area.  Spiritual beliefs and practices may help some smokers quit.  Download a "quit meter" on your computer to keep track of quit statistics, such as how long you have gone without smoking, cigarettes not smoked, and money saved.  Get a self-help book about quitting smoking and staying off of tobacco. [place] yourself from urges to smoke. Talk to someone, go for a walk, or occupy your time with a task.  Change your normal  routine. Take a different route to work. Drink tea instead of coffee. Eat breakfast in a different place.  Reduce your stress. Take a hot bath, exercise, or read a book.  Plan something enjoyable to do every day. Reward yourself for not smoking.  Explore interactive web-based programs that specialize in helping you quit. GET MEDICINE AND USE IT CORRECTLY Medicines can help you stop smoking and decrease the urge to smoke. Combining medicine with the  above behavioral methods and support can greatly increase your chances of successfully quitting smoking.  Nicotine replacement therapy helps deliver nicotine to your body without the negative effects and risks of smoking. Nicotine replacement therapy includes nicotine gum, lozenges, inhalers, nasal sprays, and skin patches. Some may be available over-the-counter and others require a prescription.  Antidepressant medicine helps people abstain from smoking, but how this works is unknown. This medicine is available by prescription.  Nicotinic receptor partial agonist medicine simulates the effect of nicotine in your brain. This medicine is available by prescription. Ask your caregiver for advice about which medicines to use and how to use them based on your health history. Your caregiver will tell you what side effects to look out for if you choose to be on a medicine or therapy. Carefully read the information on the package. Do not use any other product containing nicotine while using a nicotine replacement product.  RELAPSE OR DIFFICULT SITUATIONS Most relapses occur within the first 3 months after quitting. Do not be discouraged if you start smoking again. Remember, most people try several times before finally quitting. You may have symptoms of withdrawal because your body is used to nicotine. You may crave cigarettes, be irritable, feel very hungry, cough often, get headaches, or have difficulty concentrating. The withdrawal symptoms are only temporary.  They are strongest when you first quit, but they will go away within 10 14 days. To reduce the chances of relapse, try to:  Avoid drinking alcohol. Drinking lowers your chances of successfully quitting.  Reduce the amount of caffeine you consume. Once you quit smoking, the amount of caffeine in your body increases and can give you symptoms, such as a rapid heartbeat, sweating, and anxiety.  Avoid smokers because they can make you want to smoke.  Do not let weight gain distract you. Many smokers will gain weight when they quit, usually less than 10 pounds. Eat a healthy diet and stay active. You can always lose the weight gained after you quit.  Find ways to improve your mood other than smoking. FOR MORE INFORMATION  www.smokefree.gov  Document Released: 10/25/2001 Document Revised: 05/01/2012 Document Reviewed: 02/09/2012 [facility] Patient Information 2014 [place], [place].

## 2014-04-18 NOTE — ED Provider Notes (Signed)
CSN: [id number]     Arrival date & time 04/18/14  1009 History   First MD Initiated Contact with Patient 04/18/14 1047     Chief Complaint  Patient presents with  . URI   (Consider location/radiation/quality/duration/timing/severity/associated sxs/prior Treatment) HPI Comments: 43 year old female complaining of runny nose, postnasal drip, cough, chest congestion, shortness of breath for 5-6 days. She denies fever. She is a current daily smoker. Used Albuterol HFA 3-4 times yesterday, not today.   Past Medical History  Diagnosis Date  . Hepatitis C    History reviewed. No pertinent past surgical history. Family History  Problem Relation Age of Onset  . Diabetes Mother   . Cancer Father     colon   History  Substance Use Topics  . Smoking status: Current Every Day Smoker  . Smokeless tobacco: Never Used  . Alcohol Use: No   OB History   Grav Para Term Preterm Abortions TAB SAB Ect Mult Living   5 4 4  0 1 0 0 0 0 4     Review of Systems  Constitutional: Positive for activity change. Negative for fever, chills, appetite change and fatigue.  HENT: Positive for congestion, postnasal drip and rhinorrhea. Negative for ear pain, facial swelling and sore throat.   Eyes: Negative.  Negative for redness and visual disturbance.  Respiratory: Positive for cough, chest tightness, shortness of breath and wheezing.   Cardiovascular: Negative.  Negative for chest pain, palpitations and leg swelling.  Gastrointestinal: Negative.   Genitourinary: Negative.   Musculoskeletal: Negative for neck pain and neck stiffness.  Skin: Negative for pallor and rash.  Neurological: Negative.     Allergies  Review of patient's allergies indicates no known allergies.  Home Medications   Prior to Admission medications   Medication Sig Start Date End Date Taking? Authorizing Provider  albuterol (PROVENTIL HFA;VENTOLIN HFA) 108 (90 BASE) MCG/ACT inhaler Inhale 2 puffs into the lungs every 4 (four) hours  as needed for wheezing or shortness of breath. 04/18/14   Janne Napoleon, NP  methylPREDNISolone (MEDROL DOSEPAK) 4 MG tablet follow package directions 04/18/14   Janne Napoleon, NP  Multiple Vitamins-Minerals (MULTIVITAMIN WITH MINERALS) tablet Take 1 tablet by mouth daily.    Historical Provider, MD  pantoprazole (PROTONIX) 40 MG tablet Take 1 tablet (40 mg total) by mouth daily. 09/30/13   Saddie Benders. Ghim, MD  valACYclovir (VALTREX) 500 MG tablet Take 500 mg by mouth 2 (two) times daily as needed (for outbreaks).    Historical Provider, MD  vitamin E 100 UNIT capsule Take 100 Units by mouth daily.    Historical Provider, MD   BP 141/84  Pulse 76  Temp(Src) 98.2 F (36.8 C) (Oral)  Resp 20  SpO2 97%  LMP 03/16/2014 Physical Exam  Nursing note and vitals reviewed. Constitutional: She is oriented to person, place, and time. She appears well-developed and well-nourished. No distress.  HENT:  Mouth/Throat: No oropharyngeal exudate.  Bilat TM's nl OP with mild erythema and clear PND.  Eyes: Conjunctivae and EOM are normal.  Neck: Normal range of motion. Neck supple.  Cardiovascular: Normal rate, regular rhythm and normal heart sounds.   Pulmonary/Chest: Effort normal. No respiratory distress. She has wheezes.  Bilateral expiratory wheezes, prolonged expiratory phase. No rales  Musculoskeletal: Normal range of motion. She exhibits no edema.  Lymphadenopathy:    She has no cervical adenopathy.  Neurological: She is alert and oriented to person, place, and time.  Skin: Skin is warm and dry. No rash noted.  Psychiatric: She has a normal mood and affect.    ED Course  Procedures (including critical care time) Labs Review Labs Reviewed - No data to display  Imaging Review No results found.   MDM   1. Allergic rhinitis   2. RAD (reactive airway disease) with wheezing   3. Smoking addiction   4. Bronchitis     S/P duoneb and Kenalog 40 mg pt st feeling and breathing much better.  Auscultation with substantial decrease in wheezes and improved air movement.  D/C with medrol dose pk OTC allergy meds. F/U with PCP next week Stop smoking. Cessation instructions.      Janne Napoleon, NP 04/18/14 1136

## 2014-04-18 NOTE — ED Notes (Signed)
Initially had runny nose and sore throat that started 3 days ago.  These symptoms have changed to chest tightness, wheezing, yellow phlegm, denies fever, having hot and cold flashes, and cough.

## 2014-06-05 ENCOUNTER — Encounter (HOSPITAL_COMMUNITY): Payer: Self-pay | Admitting: Emergency Medicine

## 2014-06-05 ENCOUNTER — Emergency Department (HOSPITAL_COMMUNITY)
Admission: EM | Admit: 2014-06-05 | Discharge: 2014-06-05 | Disposition: A | Discharge status: Home / Self Care | Payer: Medicaid Other | Attending: Emergency Medicine | Admitting: Emergency Medicine

## 2014-06-05 DIAGNOSIS — Z79899 Other long term (current) drug therapy: Secondary | ICD-10-CM | POA: Insufficient documentation

## 2014-06-05 DIAGNOSIS — X500XXA Overexertion from strenuous movement or load, initial encounter: Secondary | ICD-10-CM | POA: Diagnosis not present

## 2014-06-05 DIAGNOSIS — F17200 Nicotine dependence, unspecified, uncomplicated: Secondary | ICD-10-CM | POA: Insufficient documentation

## 2014-06-05 DIAGNOSIS — M545 Low back pain, unspecified: Secondary | ICD-10-CM | POA: Insufficient documentation

## 2014-06-05 DIAGNOSIS — Y9302 Activity, running: Secondary | ICD-10-CM | POA: Diagnosis not present

## 2014-06-05 DIAGNOSIS — Y9289 Other specified places as the place of occurrence of the external cause: Secondary | ICD-10-CM | POA: Insufficient documentation

## 2014-06-05 DIAGNOSIS — S39012A Strain of muscle, fascia and tendon of lower back, initial encounter: Secondary | ICD-10-CM

## 2014-06-05 DIAGNOSIS — Z8619 Personal history of other infectious and parasitic diseases: Secondary | ICD-10-CM | POA: Insufficient documentation

## 2014-06-05 DIAGNOSIS — X503XXA Overexertion from repetitive movements, initial encounter: Secondary | ICD-10-CM

## 2014-06-05 DIAGNOSIS — S336XXA Sprain of sacroiliac joint, initial encounter: Secondary | ICD-10-CM | POA: Insufficient documentation

## 2014-06-05 MED ORDER — OXYCODONE-ACETAMINOPHEN 5-325 MG PO TABS
1.0000 | ORAL_TABLET | Freq: Once | ORAL | Status: DC
  Filled 2014-06-05: qty 1

## 2014-06-05 MED ORDER — METHOCARBAMOL 500 MG PO TABS: 500.0000 mg | ORAL_TABLET | Freq: Two times a day (BID) | ORAL | Status: DC

## 2014-06-05 MED ORDER — METHOCARBAMOL 500 MG PO TABS
500.0000 mg | ORAL_TABLET | Freq: Once | ORAL | Status: AC
  Administered 2014-06-05: 500 mg via ORAL
  Filled 2014-06-05: qty 1

## 2014-06-05 MED ORDER — IBUPROFEN 400 MG PO TABS
400.0000 mg | ORAL_TABLET | Freq: Once | ORAL | Status: AC
Start: 2014-06-05 — End: 2014-06-05
  Administered 2014-06-05: 400 mg via ORAL
  Filled 2014-06-05: qty 1

## 2014-06-05 NOTE — ED Provider Notes (Signed)
CSN: [id number]     Arrival date & time 06/05/14  1702 History  This chart was scribed for non-physician practitioner, Domenic Moras, PA-C, working with Fredia Sorrow, MD, by Delphia Grates, ED Scribe. This patient was seen in room TR06C/TR06C and the patient's care was started at 5:32 PM.    Chief Complaint  Patient presents with  . Back Pain      The history is provided by the patient. No language interpreter was used.    HPI Comments: Latoya Klein is a 43 y.o. female who presents to the Emergency Department complaining of right lower back pain onset 2 days ago. Patient states that, 2 days ago, she started a new workout that consists of walking and jogging. Since then she developed gradual onset of R lower back pain.  She describes the pain as localized and states it feels similar to a "contraction". She reports the pain is worse when laying on her right side. She denies numbness, weakness, paresthesia, bowel/bladder incontinence, or any urinary symptoms. She denies any injuries. She states has taken ibuprofen every 4 hours and reports a total of 12 doses since onset of pain with minimal relief.   Patient has history of Hepatitis C.   Past Medical History  Diagnosis Date  . Hepatitis C    History reviewed. No pertinent past surgical history. Family History  Problem Relation Age of Onset  . Diabetes Mother   . Cancer Father     colon   History  Substance Use Topics  . Smoking status: Current Every Day Smoker  . Smokeless tobacco: Never Used  . Alcohol Use: No   OB History   Grav Para Term Preterm Abortions TAB SAB Ect Mult Living   5 4 4  0 1 0 0 0 0 4     Review of Systems  Genitourinary: Negative for hematuria.  Musculoskeletal: Positive for back pain (right lower).  Neurological: Negative for weakness and numbness.      Allergies  Review of patient's allergies indicates no known allergies.  Home Medications   Prior to Admission medications   Medication Sig  Start Date End Date Taking? Authorizing Provider  albuterol (PROVENTIL HFA;VENTOLIN HFA) 108 (90 BASE) MCG/ACT inhaler Inhale 2 puffs into the lungs every 4 (four) hours as needed for wheezing or shortness of breath. 04/18/14   Janne Napoleon, NP  methylPREDNISolone (MEDROL DOSEPAK) 4 MG tablet follow package directions 04/18/14   Janne Napoleon, NP  Multiple Vitamins-Minerals (MULTIVITAMIN WITH MINERALS) tablet Take 1 tablet by mouth daily.    Historical Provider, MD  pantoprazole (PROTONIX) 40 MG tablet Take 1 tablet (40 mg total) by mouth daily. 09/30/13   Saddie Benders. Ghim, MD  valACYclovir (VALTREX) 500 MG tablet Take 500 mg by mouth 2 (two) times daily as needed (for outbreaks).    Historical Provider, MD  vitamin E 100 UNIT capsule Take 100 Units by mouth daily.    Historical Provider, MD   Triage Vitals: BP 127/86  Pulse 91  Temp(Src) 98.9 F (37.2 C) (Oral)  Resp 18  Ht 5\' 6"  (1.676 m)  Wt 204 lb (92.534 kg)  BMI 32.94 kg/m2  SpO2 95%  LMP 05/14/2014  Physical Exam  Nursing note and vitals reviewed. Constitutional: She is oriented to person, place, and time. She appears well-developed and well-nourished. No distress.  HENT:  Head: Normocephalic and atraumatic.  Eyes: Conjunctivae and EOM are normal.  Neck: Neck supple. No tracheal deviation present.  Cardiovascular: Normal rate.   Pulmonary/Chest:  Effort normal. No respiratory distress.  Musculoskeletal: She exhibits tenderness.  TTP to right paralumbar region. No overlying skin changes or rash. Normal ROM to bilateral hip. No tenderness to midline spine. No crepitus. No stepoffs.  Neurological: She is alert and oriented to person, place, and time.  Skin: Skin is warm and dry.  Psychiatric: She has a normal mood and affect. Her behavior is normal.    ED Course  Procedures (including critical care time)  DIAGNOSTIC STUDIES: Oxygen Saturation is 95% on room air, adequate by my interpretation.    COORDINATION OF CARE: At 1736  Discussed treatment plan with patient which includes NSAIDS and muscle relaxant. Pt and I agree that there are no suspicions of fracture therefore no imaging will be ordered. Patient agrees.   Labs Review Labs Reviewed - No data to display  Imaging Review No results found.   EKG Interpretation None      MDM   Final diagnoses:  Repetitive strain injury of lower back, initial encounter    BP 127/86  Pulse 91  Temp(Src) 98.9 F (37.2 C) (Oral)  Resp 18  Ht 5\' 6"  (1.676 m)  Wt 204 lb (92.534 kg)  BMI 32.94 kg/m2  SpO2 95%  LMP 05/14/2014   I personally performed the services described in this documentation, which was scribed in my presence. The recorded information has been reviewed and is accurate.     Domenic Moras, PA-C 06/05/14 1746

## 2014-06-05 NOTE — ED Notes (Signed)
Declined W/C at D/C and was escorted to lobby by RN. 

## 2014-06-05 NOTE — ED Provider Notes (Signed)
Medical screening examination/treatment/procedure(s) were performed by non-physician practitioner and as supervising physician I was immediately available for consultation/collaboration.   EKG Interpretation None        Fredia Sorrow, MD 06/05/14 1850

## 2014-06-05 NOTE — Discharge Instructions (Signed)
RICE: Routine Care for Injuries The routine care of many injuries includes Rest, Ice, Compression, and Elevation (RICE). HOME CARE INSTRUCTIONS  Rest is needed to allow your body to heal. Routine activities can usually be resumed when comfortable. Injured tendons and bones can take up to 6 weeks to heal. Tendons are the cord-like structures that attach muscle to bone.  Ice following an injury helps keep the swelling down and reduces pain.  Put ice in a plastic bag.  Place a towel between your skin and the bag.  Leave the ice on for 15-20 minutes, 3-4 times a day, or as directed by your health care provider. Do this while awake, for the first 24 to 48 hours. After that, continue as directed by your caregiver.  Compression helps keep swelling down. It also gives support and helps with discomfort. If an elastic bandage has been applied, it should be removed and reapplied every 3 to 4 hours. It should not be applied tightly, but firmly enough to keep swelling down. Watch fingers or toes for swelling, bluish discoloration, coldness, numbness, or excessive pain. If any of these problems occur, remove the bandage and reapply loosely. Contact your caregiver if these problems continue.  Elevation helps reduce swelling and decreases pain. With extremities, such as the arms, hands, legs, and feet, the injured area should be placed near or above the level of the heart, if possible. SEEK IMMEDIATE MEDICAL CARE IF:  You have persistent pain and swelling.  You develop redness, numbness, or unexpected weakness.  Your symptoms are getting worse rather than improving after several days. These symptoms may indicate that further evaluation or further X-rays are needed. Sometimes, X-rays may not show a small broken bone (fracture) until 1 week or 10 days later. Make a follow-up appointment with your caregiver. Ask when your X-ray results will be ready. Make sure you get your X-ray results. Document Released:  02/12/2001 Document Revised: 11/05/2013 Document Reviewed: 04/01/2011 [facility] Patient Information 2015 [place], [place]. This information is not intended to replace advice given to you by your health care provider. Make sure you discuss any questions you have with your health care provider.   Back Exercises These exercises may help you when beginning to rehabilitate your injury. Your symptoms may resolve with or without further involvement from your physician, physical therapist or athletic trainer. While completing these exercises, remember:   Restoring tissue flexibility helps normal motion to return to the joints. This allows healthier, less painful movement and activity.  An effective stretch should be held for at least 30 seconds.  A stretch should never be painful. You should only feel a gentle lengthening or release in the stretched tissue. STRETCH - Extension, Prone on Elbows   Lie on your stomach on the floor, a bed will be too soft. Place your palms about shoulder width apart and at the height of your head.  Place your elbows under your shoulders. If this is too painful, stack pillows under your chest.  Allow your body to relax so that your hips drop lower and make contact more completely with the floor.  Hold this position for __________ seconds.  Slowly return to lying flat on the floor. Repeat __________ times. Complete this exercise __________ times per day.  RANGE OF MOTION - Extension, Prone Press Ups   Lie on your stomach on the floor, a bed will be too soft. Place your palms about shoulder width apart and at the height of your head.  Keeping your back as  relaxed as possible, slowly straighten your elbows while keeping your hips on the floor. You may adjust the placement of your hands to maximize your comfort. As you gain motion, your hands will come more underneath your shoulders.  Hold this position __________ seconds.  Slowly return to lying flat on the  floor. Repeat __________ times. Complete this exercise __________ times per day.  RANGE OF MOTION- Quadruped, Neutral Spine   Assume a hands and knees position on a firm surface. Keep your hands under your shoulders and your knees under your hips. You may place padding under your knees for comfort.  Drop your head and point your tail bone toward the ground below you. This will round out your low back like an angry cat. Hold this position for __________ seconds.  Slowly lift your head and release your tail bone so that your back sags into a large arch, like an old horse.  Hold this position for __________ seconds.  Repeat this until you feel limber in your low back.  Now, find your "sweet spot." This will be the most comfortable position somewhere between the two previous positions. This is your neutral spine. Once you have found this position, tense your stomach muscles to support your low back.  Hold this position for __________ seconds. Repeat __________ times. Complete this exercise __________ times per day.  STRETCH - Flexion, Single Knee to Chest   Lie on a firm bed or floor with both legs extended in front of you.  Keeping one leg in contact with the floor, bring your opposite knee to your chest. Hold your leg in place by either grabbing behind your thigh or at your knee.  Pull until you feel a gentle stretch in your low back. Hold __________ seconds.  Slowly release your grasp and repeat the exercise with the opposite side. Repeat __________ times. Complete this exercise __________ times per day.  STRETCH - Hamstrings, Standing  Stand or sit and extend your right / left leg, placing your foot on a chair or foot stool  Keeping a slight arch in your low back and your hips straight forward.  Lead with your chest and lean forward at the waist until you feel a gentle stretch in the back of your right / left knee or thigh. (When done correctly, this exercise requires leaning only a  small distance.)  Hold this position for __________ seconds. Repeat __________ times. Complete this stretch __________ times per day. STRENGTHENING - Deep Abdominals, Pelvic Tilt   Lie on a firm bed or floor. Keeping your legs in front of you, bend your knees so they are both pointed toward the ceiling and your feet are flat on the floor.  Tense your lower abdominal muscles to press your low back into the floor. This motion will rotate your pelvis so that your tail bone is scooping upwards rather than pointing at your feet or into the floor.  With a gentle tension and even breathing, hold this position for __________ seconds. Repeat __________ times. Complete this exercise __________ times per day.  STRENGTHENING - Abdominals, Crunches   Lie on a firm bed or floor. Keeping your legs in front of you, bend your knees so they are both pointed toward the ceiling and your feet are flat on the floor. Cross your arms over your chest.  Slightly tip your chin down without bending your neck.  Tense your abdominals and slowly lift your trunk high enough to just clear your shoulder blades. Lifting higher  can put excessive stress on the low back and does not further strengthen your abdominal muscles.  Control your return to the starting position. Repeat __________ times. Complete this exercise __________ times per day.  STRENGTHENING - Quadruped, Opposite UE/LE Lift   Assume a hands and knees position on a firm surface. Keep your hands under your shoulders and your knees under your hips. You may place padding under your knees for comfort.  Find your neutral spine and gently tense your abdominal muscles so that you can maintain this position. Your shoulders and hips should form a rectangle that is parallel with the floor and is not twisted.  Keeping your trunk steady, lift your right hand no higher than your shoulder and then your left leg no higher than your hip. Make sure you are not holding your  breath. Hold this position __________ seconds.  Continuing to keep your abdominal muscles tense and your back steady, slowly return to your starting position. Repeat with the opposite arm and leg. Repeat __________ times. Complete this exercise __________ times per day. Document Released: 11/18/2005 Document Revised: 01/23/2012 Document Reviewed: 02/12/2009 [facility] Patient Information 2015 [place], [place]. This information is not intended to replace advice given to you by your health care provider. Make sure you discuss any questions you have with your health care provider.

## 2014-06-05 NOTE — ED Notes (Signed)
Pt reports that she has been running the last couple days and yesterday started having R lower back spasms. Denies urinary sx, numbness/tingling, loss of bowel or bladder.

## 2014-09-06 ENCOUNTER — Inpatient Hospital Stay
Admit: 2014-09-06 | Discharge: 2014-09-06 | Disposition: A | Discharge status: Home / Self Care | Payer: MEDICAID | Attending: Emergency Medicine

## 2014-09-06 DIAGNOSIS — M545 Low back pain: Secondary | ICD-10-CM

## 2014-09-06 MED ORDER — OXYCODONE-ACETAMINOPHEN 5 MG-325 MG TAB
5-325 mg | ORAL_TABLET | Freq: Four times a day (QID) | ORAL | Status: AC | PRN
Start: 2014-09-06 — End: ?

## 2014-09-06 MED ORDER — DIAZEPAM 5 MG TAB
5 mg | ORAL_TABLET | Freq: Two times a day (BID) | ORAL | Status: AC | PRN
Start: 2014-09-06 — End: ?
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
## 2014-09-06 NOTE — ED Notes (Signed)
Patient has been given discharge instructions by PA and patient was given the opportunity to ask questions.  Nurse also reinforced discharge instructions and patient has verbalized understanding.

## 2014-09-15 ENCOUNTER — Encounter (HOSPITAL_COMMUNITY): Payer: Self-pay | Admitting: Emergency Medicine

## 2014-11-30 ENCOUNTER — Encounter (HOSPITAL_COMMUNITY): Payer: Self-pay | Admitting: *Deleted

## 2014-11-30 ENCOUNTER — Emergency Department (HOSPITAL_COMMUNITY)
Admission: EM | Admit: 2014-11-30 | Discharge: 2014-11-30 | Disposition: A | Discharge status: Home / Self Care | Payer: Medicaid Other | Attending: Emergency Medicine | Admitting: Emergency Medicine

## 2014-11-30 ENCOUNTER — Emergency Department (HOSPITAL_COMMUNITY): Payer: Medicaid Other

## 2014-11-30 DIAGNOSIS — M791 Myalgia: Secondary | ICD-10-CM | POA: Insufficient documentation

## 2014-11-30 DIAGNOSIS — Z8619 Personal history of other infectious and parasitic diseases: Secondary | ICD-10-CM | POA: Insufficient documentation

## 2014-11-30 DIAGNOSIS — R079 Chest pain, unspecified: Secondary | ICD-10-CM | POA: Diagnosis present

## 2014-11-30 DIAGNOSIS — R05 Cough: Secondary | ICD-10-CM

## 2014-11-30 DIAGNOSIS — R1110 Vomiting, unspecified: Secondary | ICD-10-CM | POA: Insufficient documentation

## 2014-11-30 DIAGNOSIS — Z79899 Other long term (current) drug therapy: Secondary | ICD-10-CM | POA: Insufficient documentation

## 2014-11-30 DIAGNOSIS — R059 Cough, unspecified: Secondary | ICD-10-CM

## 2014-11-30 DIAGNOSIS — Z720 Tobacco use: Secondary | ICD-10-CM | POA: Insufficient documentation

## 2014-11-30 DIAGNOSIS — J40 Bronchitis, not specified as acute or chronic: Secondary | ICD-10-CM | POA: Insufficient documentation

## 2014-11-30 LAB — I-STAT CHEM 8, ED
BUN: 11 mg/dL (ref 6–23)
CALCIUM ION: 1.19 mmol/L (ref 1.12–1.23)
CHLORIDE: 104 meq/L (ref 96–112)
CREATININE: 0.9 mg/dL (ref 0.50–1.10)
GLUCOSE: 165 mg/dL — AB (ref 70–99)
HEMATOCRIT: 35 % — AB (ref 36.0–46.0)
Hemoglobin: 11.9 g/dL — ABNORMAL LOW (ref 12.0–15.0)
Potassium: 3.5 mmol/L (ref 3.5–5.1)
Sodium: 141 mmol/L (ref 135–145)
TCO2: 24 mmol/L (ref 0–100)

## 2014-11-30 LAB — I-STAT TROPONIN, ED: Troponin i, poc: 0 ng/mL (ref 0.00–0.08)

## 2014-11-30 IMAGING — CR DG CHEST 2V
2 series · 2 of 2 positions shown · non-contrast
Comparison: 09/30/2010

CLINICAL DATA: Chest pain.

EXAM:
CHEST  2 VIEW

[w chest pa]
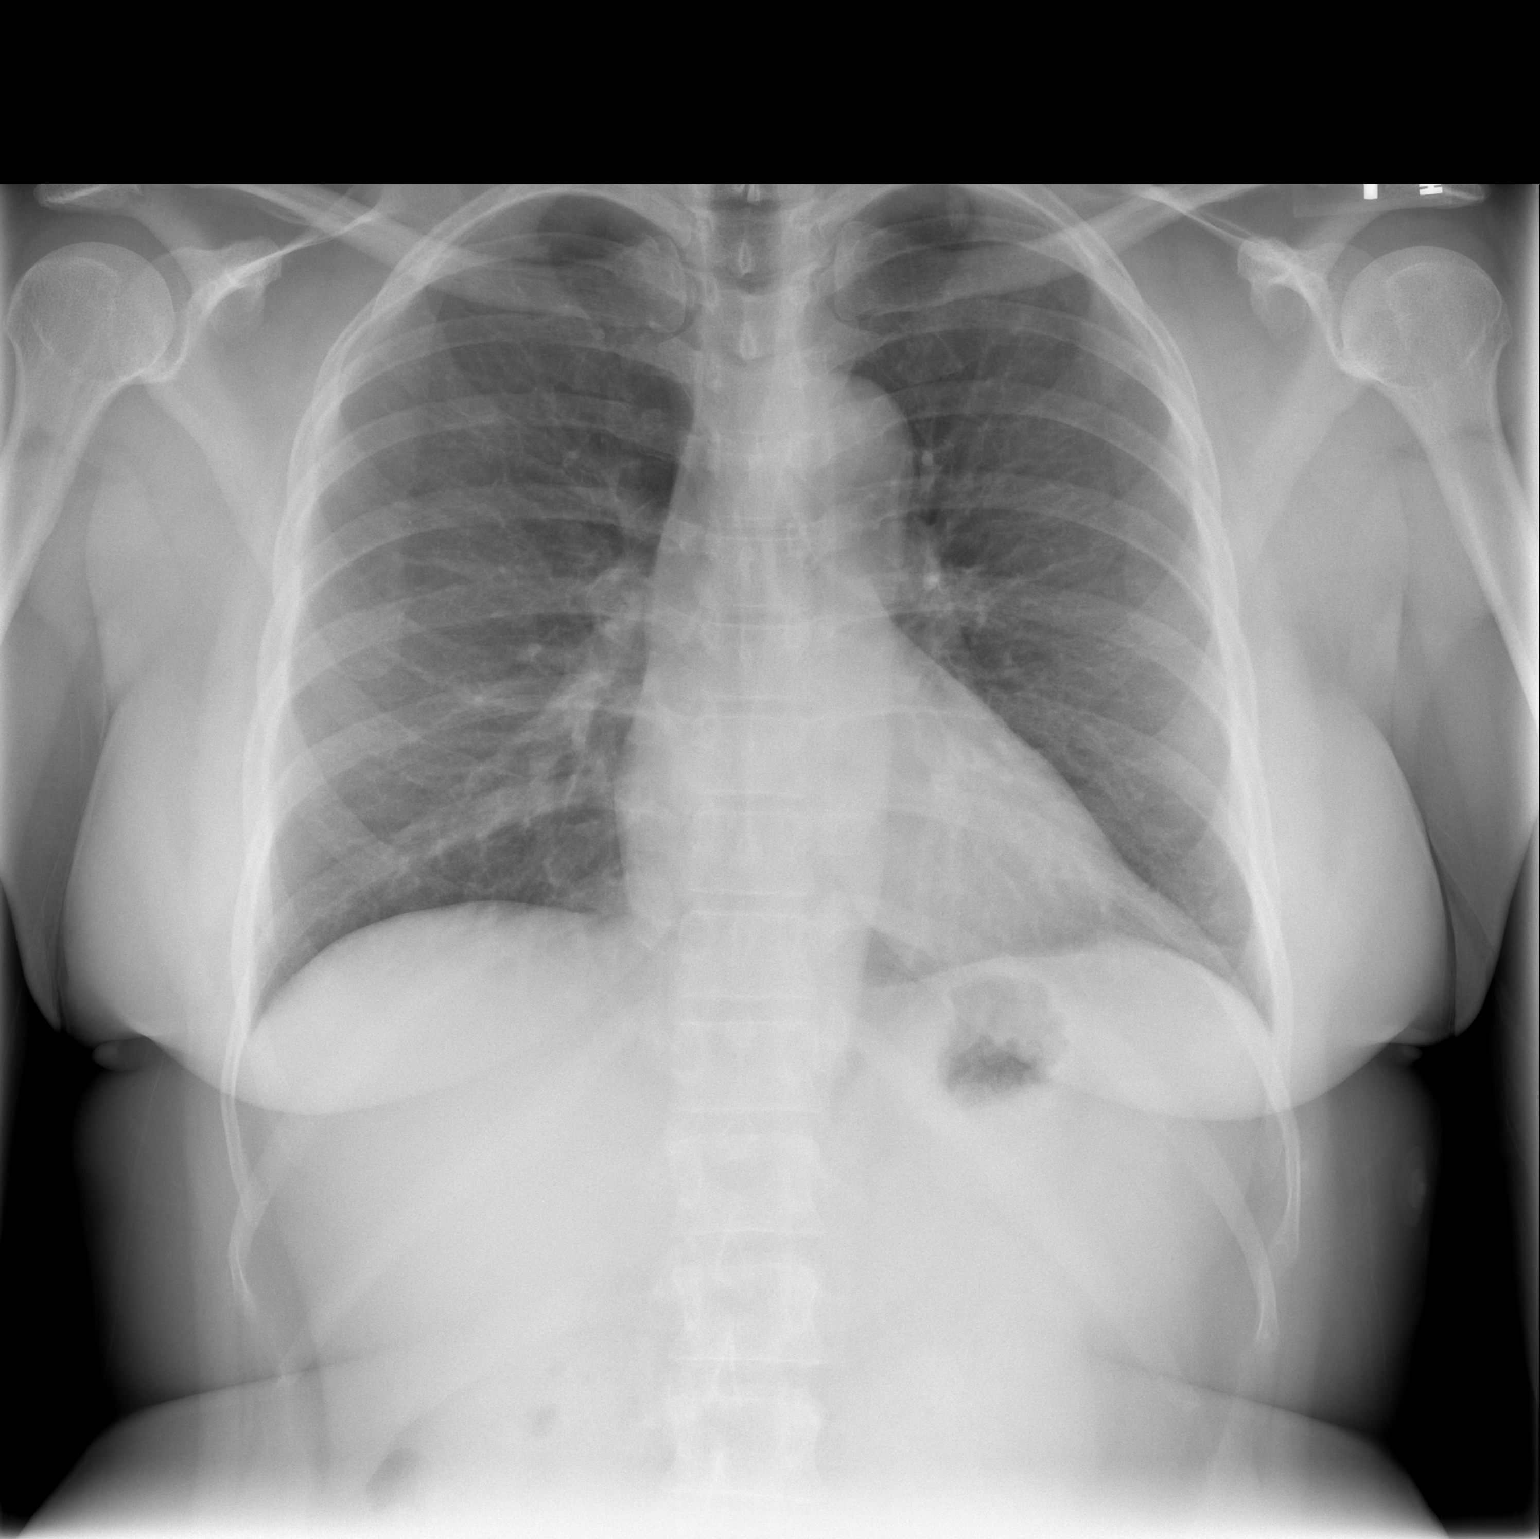

[w chest lat]
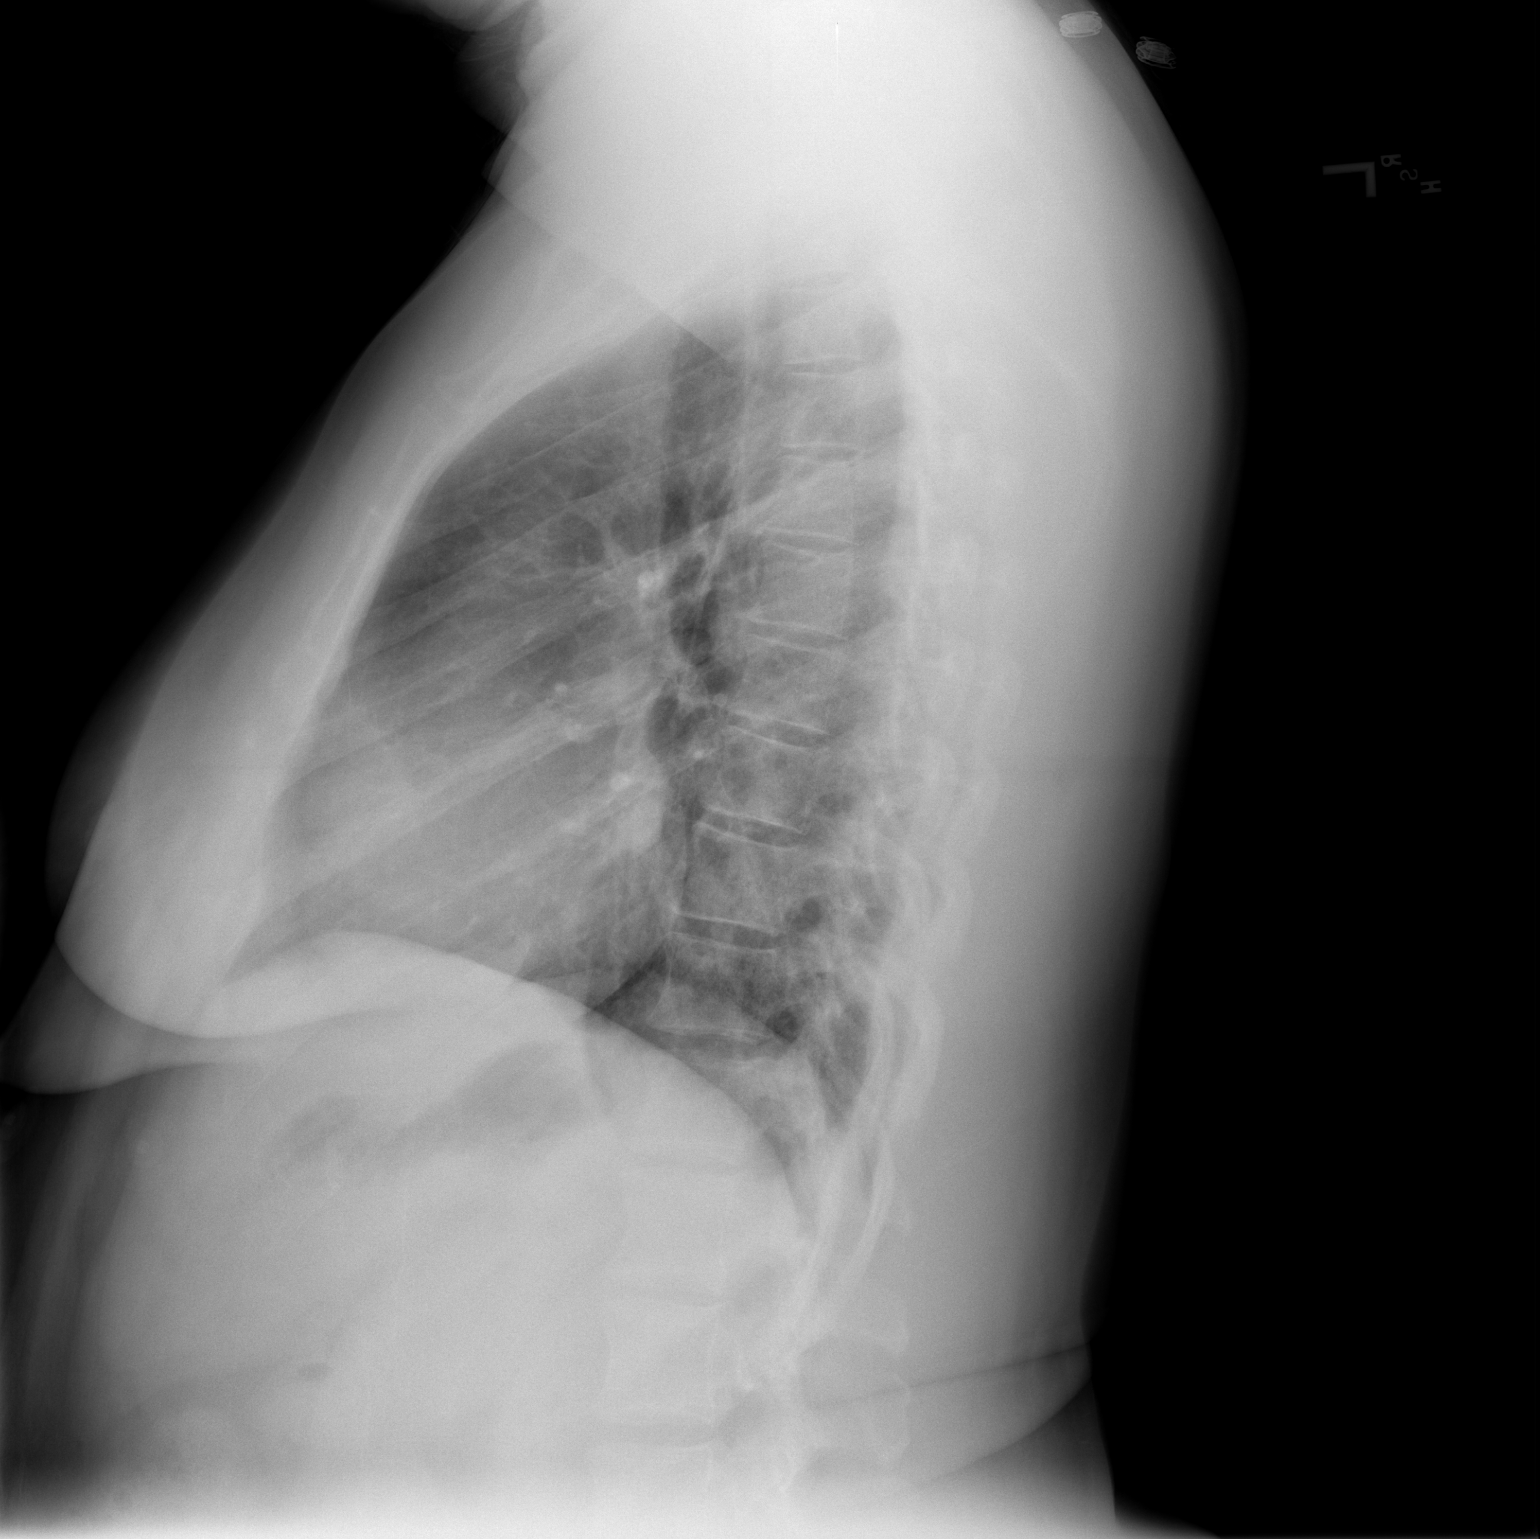

[2 of 2 positions shown; findings below may reference images not displayed]

FINDINGS: The heart size and mediastinal contours are within normal limits.
Both lungs are clear. The visualized skeletal structures are
unremarkable.
IMPRESSION: No active cardiopulmonary disease.

## 2014-11-30 MED ORDER — OXYMETAZOLINE HCL 0.05 % NA SOLN
1.0000 | Freq: Once | NASAL | Status: AC
  Administered 2014-11-30: 1 via NASAL
  Filled 2014-11-30: qty 15

## 2014-11-30 MED ORDER — PREDNISONE 20 MG PO TABS
60.0000 mg | ORAL_TABLET | Freq: Once | ORAL | Status: AC
  Administered 2014-11-30: 60 mg via ORAL
  Filled 2014-11-30: qty 3

## 2014-11-30 MED ORDER — PREDNISONE 20 MG PO TABS: 40.0000 mg | ORAL_TABLET | Freq: Every day | ORAL | Status: DC

## 2014-11-30 MED ORDER — HYDROCOD POLST-CHLORPHEN POLST 10-8 MG/5ML PO LQCR: 5.0000 mL | Freq: Two times a day (BID) | ORAL | Status: DC

## 2014-11-30 MED ORDER — ALBUTEROL SULFATE (2.5 MG/3ML) 0.083% IN NEBU
5.0000 mg | INHALATION_SOLUTION | Freq: Once | RESPIRATORY_TRACT | Status: AC
  Administered 2014-11-30: 5 mg via RESPIRATORY_TRACT
  Filled 2014-11-30: qty 6

## 2014-11-30 MED ORDER — IPRATROPIUM BROMIDE 0.02 % IN SOLN
0.5000 mg | Freq: Once | RESPIRATORY_TRACT | Status: AC
  Administered 2014-11-30: 0.5 mg via RESPIRATORY_TRACT
  Filled 2014-11-30: qty 2.5

## 2014-11-30 MED ORDER — ALBUTEROL SULFATE HFA 108 (90 BASE) MCG/ACT IN AERS
1.0000 | INHALATION_SPRAY | RESPIRATORY_TRACT | Status: DC | PRN
  Administered 2014-11-30: 2 via RESPIRATORY_TRACT
  Filled 2014-11-30: qty 6.7

## 2014-11-30 NOTE — ED Notes (Signed)
Pt reports having cold symptoms for several days. Hx of bronchitis, reports having a productive cough with green sputum. Has chest pain when she coughs.

## 2014-11-30 NOTE — ED Provider Notes (Addendum)
CSN: [id number]     Arrival date & time 11/30/14  1234 History   First MD Initiated Contact with Patient 11/30/14 1547     Chief Complaint  Patient presents with  . Chest Pain  . Cough     (Consider location/radiation/quality/duration/timing/severity/associated sxs/prior Treatment) Patient is a 44 y.o. female presenting with chest pain and cough. The history is provided by the patient.  Chest Pain Pain location:  L chest and R chest Pain quality: aching, burning and tightness   Pain radiates to:  Does not radiate Pain radiates to the back: no   Pain severity:  Moderate Onset quality:  Gradual Duration:  3 days Timing:  Constant Progression:  Worsening Chronicity:  New Context comment:  Started with URI symptoms. Cough, congestion, sinus pressure and pain and now every time she coughs her chest hurts Relieved by:  Nothing Worsened by:  Deep breathing and coughing Ineffective treatments: OTC medications and nasal spray. Associated symptoms: cough, shortness of breath and vomiting   Associated symptoms: no fever   Associated symptoms comment:  Wheezing, sinus congestion, sputum Risk factors: smoking   Risk factors: no diabetes mellitus, no hypertension, no immobilization and no surgery   Cough Associated symptoms: chest pain, chills, myalgias and shortness of breath   Associated symptoms: no fever     Past Medical History  Diagnosis Date  . Hepatitis C    History reviewed. No pertinent past surgical history. Family History  Problem Relation Age of Onset  . Diabetes Mother   . Cancer Father     colon   History  Substance Use Topics  . Smoking status: Current Every Day Smoker  . Smokeless tobacco: Never Used  . Alcohol Use: No   OB History    Gravida Para Term Preterm AB TAB SAB Ectopic Multiple Living   5 4 4  0 1 0 0 0 0 4     Review of Systems  Constitutional: Positive for chills. Negative for fever.  Respiratory: Positive for cough and shortness of breath.    Cardiovascular: Positive for chest pain.  Gastrointestinal: Positive for vomiting.  Musculoskeletal: Positive for myalgias.  All other systems reviewed and are negative.     Allergies  Review of patient's allergies indicates no known allergies.  Home Medications   Prior to Admission medications   Medication Sig Start Date End Date Taking? Authorizing Provider  albuterol (PROVENTIL HFA;VENTOLIN HFA) 108 (90 BASE) MCG/ACT inhaler Inhale 2 puffs into the lungs every 4 (four) hours as needed for wheezing or shortness of breath. 04/18/14  Yes Janne Napoleon, NP  methocarbamol (ROBAXIN) 500 MG tablet Take 1 tablet (500 mg total) by mouth 2 (two) times daily. Patient not taking: Reported on 11/30/2014 06/05/14   Domenic Moras, PA-C  methylPREDNISolone (MEDROL DOSEPAK) 4 MG tablet follow package directions Patient not taking: Reported on 11/30/2014 04/18/14   Janne Napoleon, NP  pantoprazole (PROTONIX) 40 MG tablet Take 1 tablet (40 mg total) by mouth daily. Patient not taking: Reported on 11/30/2014 09/30/13   Saddie Benders. Ghim, MD  vitamin E 100 UNIT capsule Take 100 Units by mouth daily.    Historical Provider, MD   BP 131/71 mmHg  Pulse 93  Temp(Src) 98 F (36.7 C) (Oral)  Resp 20  Ht 5\' 4"  (1.626 m)  Wt 208 lb (94.348 kg)  BMI 35.69 kg/m2  SpO2 95%  LMP 10/31/2014 Physical Exam  Constitutional: She is oriented to person, place, and time. She appears well-developed and well-nourished. No distress.  HENT:  Head: Normocephalic and atraumatic.  Right Ear: Tympanic membrane and ear canal normal.  Left Ear: Tympanic membrane and ear canal normal.  Nose: Mucosal edema and rhinorrhea present. Right sinus exhibits frontal sinus tenderness. Left sinus exhibits frontal sinus tenderness.  Mouth/Throat: Oropharynx is clear and moist.  Eyes: Conjunctivae and EOM are normal. Pupils are equal, round, and reactive to light.  Neck: Normal range of motion. Neck supple.  Cardiovascular: Normal rate, regular rhythm  and intact distal pulses.   No murmur heard. Pulmonary/Chest: Effort normal. No respiratory distress. She has wheezes. She has no rales.  Abdominal: Soft. She exhibits no distension. There is no tenderness. There is no rebound and no guarding.  Musculoskeletal: Normal range of motion. She exhibits no edema or tenderness.  Neurological: She is alert and oriented to person, place, and time.  Skin: Skin is warm and dry. No rash noted. No erythema.  Psychiatric: She has a normal mood and affect. Her behavior is normal.  Nursing note and vitals reviewed.   ED Course  Procedures (including critical care time) Labs Review Labs Reviewed  I-STAT CHEM 8, ED - Abnormal; Notable for the following:    Glucose, Bld 165 (*)    Hemoglobin 11.9 (*)    HCT 35.0 (*)    All other components within normal limits  Randolm Idol, ED    Imaging Review Dg Chest 2 View  11/30/2014   CLINICAL DATA:  Cough and congestion for 2 days.  EXAM: CHEST  2 VIEW  COMPARISON:  09/30/2013  FINDINGS: The heart size and mediastinal contours are within normal limits. Both lungs are clear. The visualized skeletal structures are unremarkable.  IMPRESSION: No active cardiopulmonary disease.   Electronically Signed   By: Markus Daft M.D.   On: 11/30/2014 13:21     EKG Interpretation   Date/Time:  Sunday November 30 2014 12:45:36 EST Ventricular Rate:  93 PR Interval:  156 QRS Duration: 82 QT Interval:  392 QTC Calculation: 487 R Axis:   45 Text Interpretation:  Normal sinus rhythm Nonspecific T wave abnormality  Prolonged QT No significant change since last tracing Confirmed by  Maryan Rued  MD, Loree Fee ([id number]) on 11/30/2014 4:09:22 PM      MDM   Final diagnoses:  Bronchitis   Pt with symptoms consistent with viral URI/bronchitis.  Well appearing here.  No signs of breathing difficulty but is wheezing.  No signs of pharyngitis, otitis or abnormal abdominal findings.   CXR wnl and pt to return with any further  problems.  Will give albuterol, Atrovent and Afrin. Patient has been using her albuterol at home with mild improvement but states she is running out. She does still use tobacco  5:33 PM Wheezing improved will d/c home  Blanchie Dessert, MD 11/30/14 [place], MD 11/30/14 1735

## 2015-07-22 ENCOUNTER — Other Ambulatory Visit: Payer: Self-pay

## 2015-07-22 DIAGNOSIS — Z1231 Encounter for screening mammogram for malignant neoplasm of breast: Secondary | ICD-10-CM

## 2015-07-28 ENCOUNTER — Ambulatory Visit
Admission: RE | Admit: 2015-07-28 | Discharge: 2015-07-28 | Disposition: A | Discharge status: Home / Self Care | Payer: Medicaid Other | Source: Ambulatory Visit

## 2015-07-28 DIAGNOSIS — Z1231 Encounter for screening mammogram for malignant neoplasm of breast: Secondary | ICD-10-CM

## 2016-01-30 IMAGING — DX DG CHEST 2V
2 series · 2 of 2 positions shown · non-contrast
Comparison: 09/30/2013

CLINICAL DATA: Cough and congestion for 2 days.

EXAM:
CHEST  2 VIEW

[chest pa]
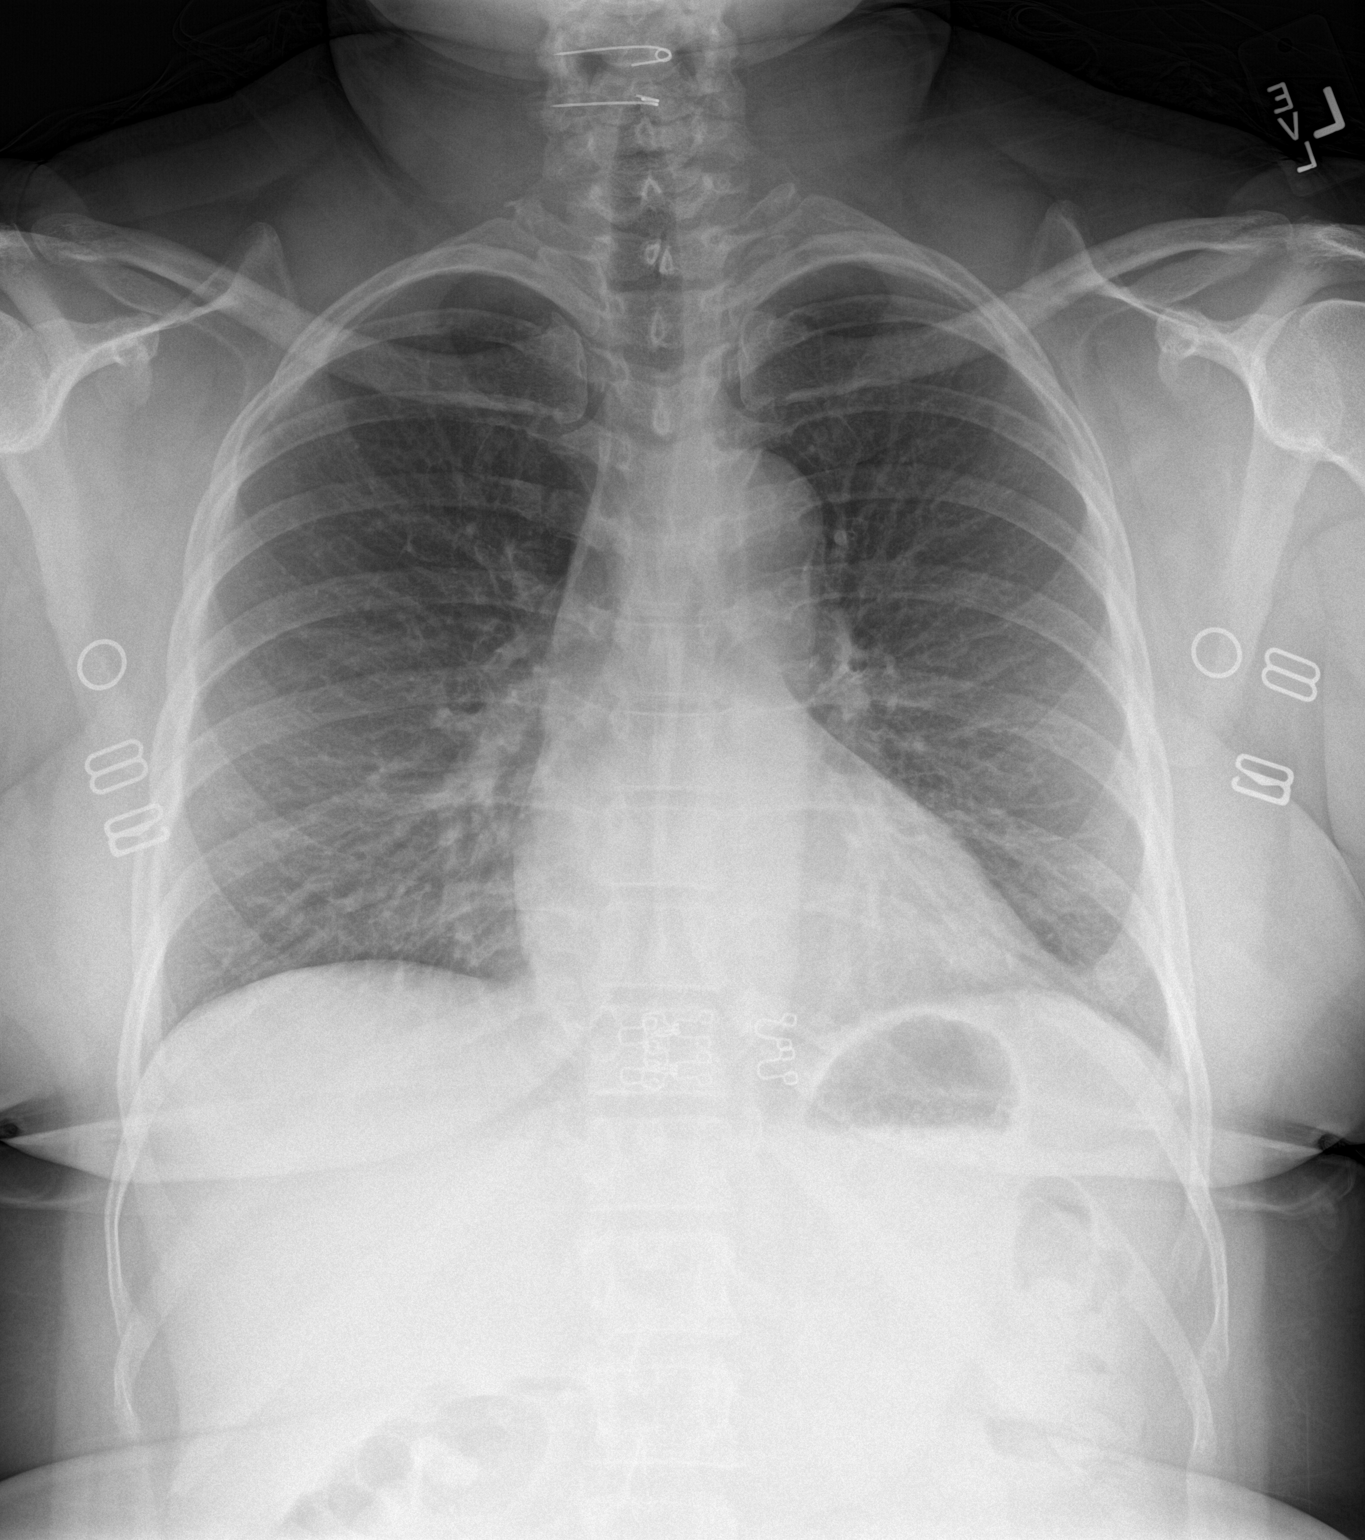

[chest lat]
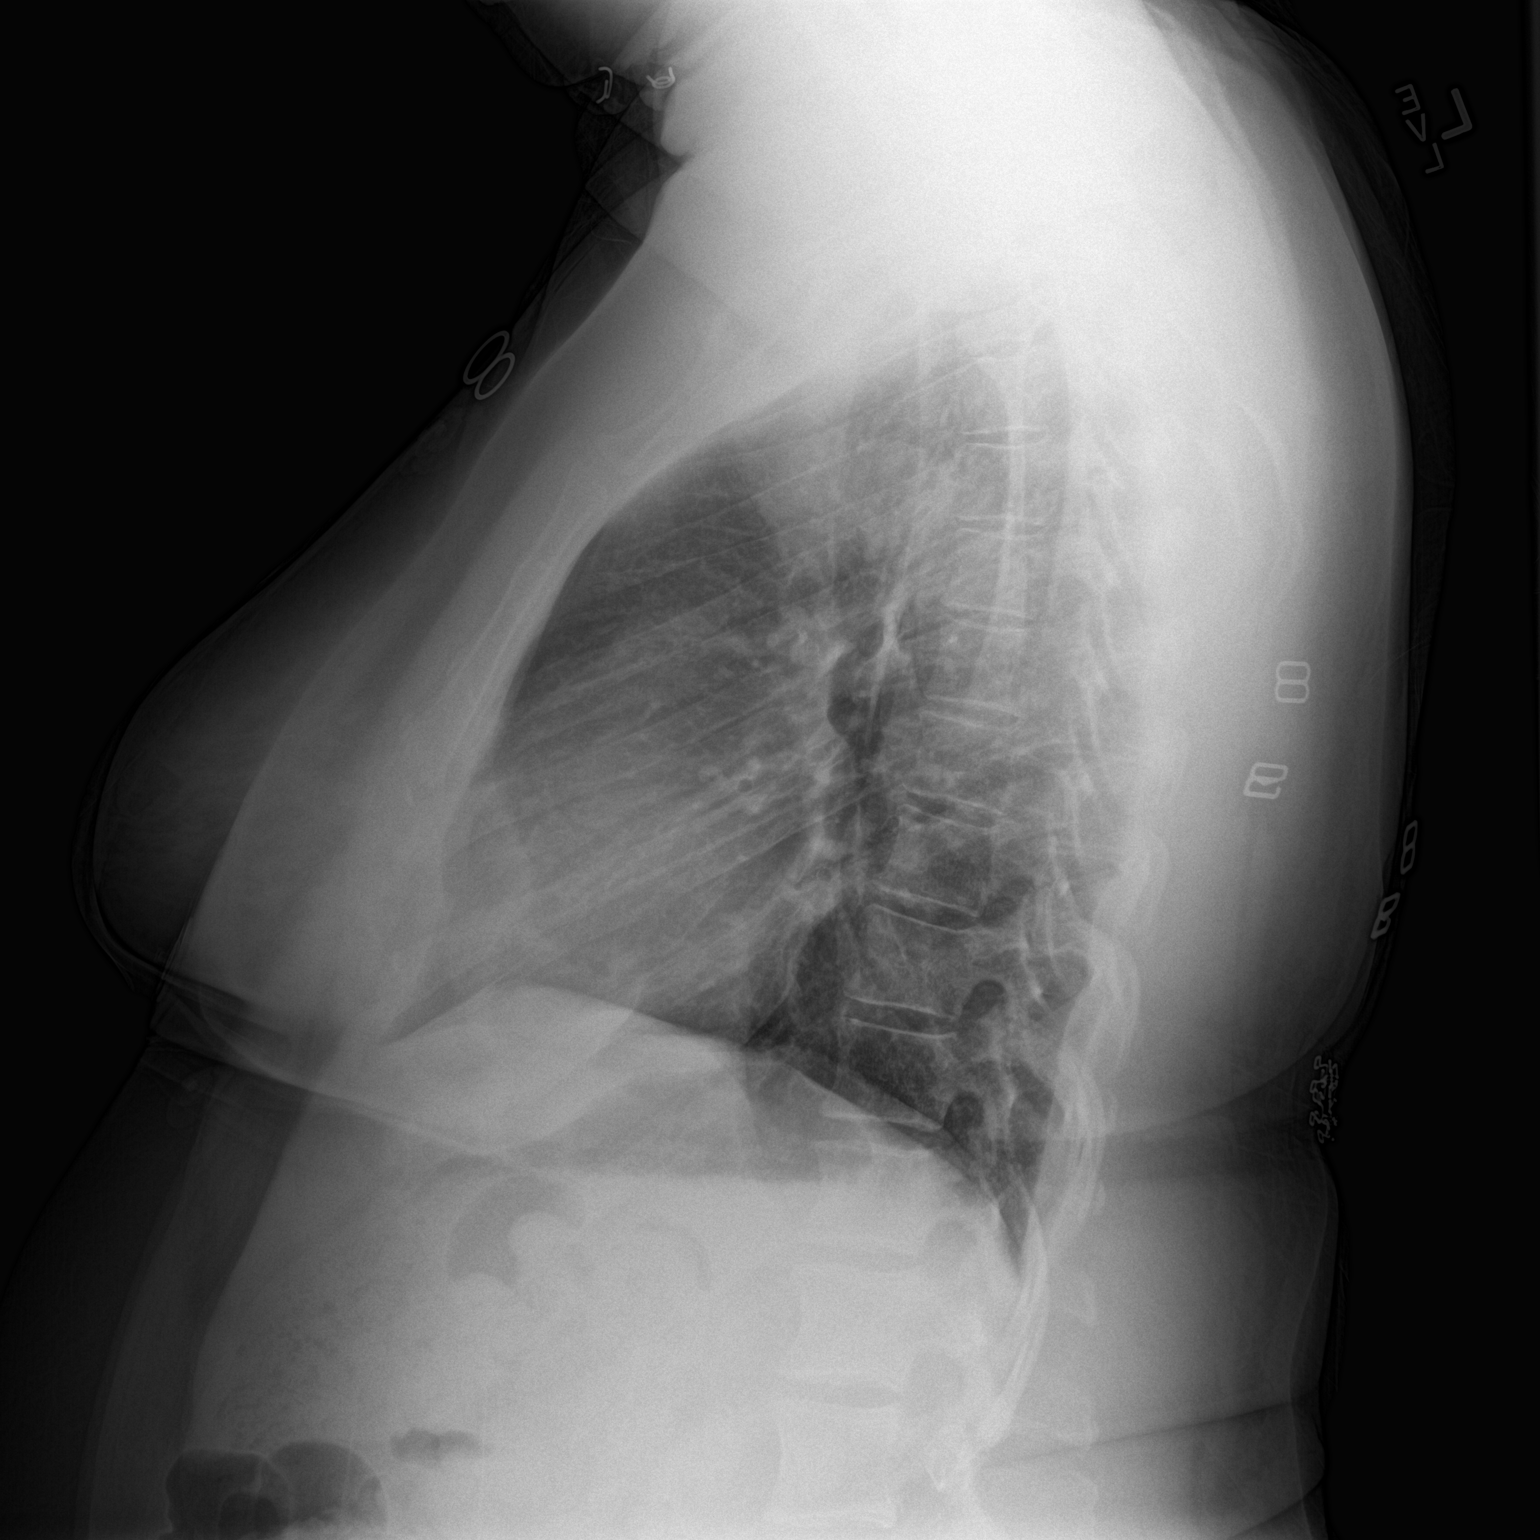

[2 of 2 positions shown; findings below may reference images not displayed]

FINDINGS: The heart size and mediastinal contours are within normal limits.
Both lungs are clear. The visualized skeletal structures are
unremarkable.
IMPRESSION: No active cardiopulmonary disease.

## 2016-02-16 ENCOUNTER — Encounter (HOSPITAL_COMMUNITY): Payer: Self-pay | Admitting: Emergency Medicine

## 2016-02-16 ENCOUNTER — Other Ambulatory Visit (HOSPITAL_COMMUNITY)
Admission: RE | Admit: 2016-02-16 | Discharge: 2016-02-16 | Disposition: A | Discharge status: Home / Self Care | Payer: Medicaid Other | Source: Ambulatory Visit | Attending: Family Medicine | Admitting: Family Medicine

## 2016-02-16 ENCOUNTER — Ambulatory Visit (HOSPITAL_COMMUNITY)
Admission: EM | Admit: 2016-02-16 | Discharge: 2016-02-16 | Disposition: A | Discharge status: Home / Self Care | Payer: Medicaid Other | Source: Home / Self Care | Attending: Family Medicine | Admitting: Family Medicine

## 2016-02-16 DIAGNOSIS — J029 Acute pharyngitis, unspecified: Secondary | ICD-10-CM | POA: Diagnosis not present

## 2016-02-16 LAB — POCT RAPID STREP A: STREPTOCOCCUS, GROUP A SCREEN (DIRECT): NEGATIVE

## 2016-02-16 MED ORDER — AMOXICILLIN 250 MG PO CAPS: 250.0000 mg | ORAL_CAPSULE | Freq: Two times a day (BID) | ORAL | Status: DC

## 2016-02-16 NOTE — Discharge Instructions (Signed)
Sore Throat A sore throat is a painful, burning, sore, or scratchy feeling of the throat. There may be pain or tenderness when swallowing or talking. You may have other symptoms with a sore throat. These include coughing, sneezing, fever, or a swollen neck. A sore throat is often the first sign of another sickness. These sicknesses may include a cold, flu, strep throat, or an infection called mono. Most sore throats go away without medical treatment.  HOME CARE   Only take medicine as told by your doctor.  Drink enough fluids to keep your pee (urine) clear or pale yellow.  Rest as needed.  Try using throat sprays, lozenges, or suck on hard candy (if older than 4 years or as told).  Sip warm liquids, such as broth, herbal tea, or warm water with honey. Try sucking on frozen ice pops or drinking cold liquids.  Rinse the mouth (gargle) with salt water. Mix 1 teaspoon salt with 8 ounces of water.  Do not smoke. Avoid being around others when they are smoking.  Put a humidifier in your bedroom at night to moisten the air. You can also turn on a hot shower and sit in the bathroom for 5-10 minutes. Be sure the bathroom door is closed. GET HELP RIGHT AWAY IF:   You have trouble breathing.  You cannot swallow fluids, soft foods, or your spit (saliva).  You have more puffiness (swelling) in the throat.  Your sore throat does not get better in 7 days.  You feel sick to your stomach (nauseous) and throw up (vomit).  You have a fever or lasting symptoms for more than 2-3 days.  You have a fever and your symptoms suddenly get worse. MAKE SURE YOU:   Understand these instructions.  Will watch your condition.  Will get help right away if you are not doing well or get worse.   This information is not intended to replace advice given to you by your health care provider. Make sure you discuss any questions you have with your health care provider.   Document Released: 08/09/2008 Document  Revised: 07/25/2012 Document Reviewed: 07/08/2012 Elsevier Interactive Patient Education 2016 Elsevier Inc[employer]  Pharyngitis Pharyngitis is redness, pain, and swelling (inflammation) of your pharynx.  CAUSES  Pharyngitis is usually caused by infection. Most of the time, these infections are from viruses (viral) and are part of a cold. However, sometimes pharyngitis is caused by bacteria (bacterial). Pharyngitis can also be caused by allergies. Viral pharyngitis may be spread from person to person by coughing, sneezing, and personal items or utensils (cups, forks, spoons, toothbrushes). Bacterial pharyngitis may be spread from person to person by more intimate contact, such as kissing.  SIGNS AND SYMPTOMS  Symptoms of pharyngitis include:   Sore throat.   Tiredness (fatigue).   Low-grade fever.   Headache.  Joint pain and muscle aches.  Skin rashes.  Swollen lymph nodes.  Plaque-like film on throat or tonsils (often seen with bacterial pharyngitis). DIAGNOSIS  Your health care provider will ask you questions about your illness and your symptoms. Your medical history, along with a physical exam, is often all that is needed to diagnose pharyngitis. Sometimes, a rapid strep test is done. Other lab tests may also be done, depending on the suspected cause.  TREATMENT  Viral pharyngitis will usually get better in 3-4 days without the use of medicine. Bacterial pharyngitis is treated with medicines that kill germs (antibiotics).  HOME CARE INSTRUCTIONS   Drink enough water and fluids to  keep your urine clear or pale yellow.   Only take over-the-counter or prescription medicines as directed by your health care provider:   If you are prescribed antibiotics, make sure you finish them even if you start to feel better.   Do not take aspirin.   Get lots of rest.   Gargle with 8 oz of salt water ( tsp of salt per 1 qt of water) as often as every 1-2 hours to soothe your throat.    Throat lozenges (if you are not at risk for choking) or sprays may be used to soothe your throat. SEEK MEDICAL CARE IF:   You have large, tender lumps in your neck.  You have a rash.  You cough up green, yellow-brown, or bloody spit. SEEK IMMEDIATE MEDICAL CARE IF:   Your neck becomes stiff.  You drool or are unable to swallow liquids.  You vomit or are unable to keep medicines or liquids down.  You have severe pain that does not go away with the use of recommended medicines.  You have trouble breathing (not caused by a stuffy nose). MAKE SURE YOU:   Understand these instructions.  Will watch your condition.  Will get help right away if you are not doing well or get worse.   This information is not intended to replace advice given to you by your health care provider. Make sure you discuss any questions you have with your health care provider.   Document Released: 10/31/2005 Document Revised: 08/21/2013 Document Reviewed: 07/08/2013 Elsevier Interactive Patient Education [employer].

## 2016-02-16 NOTE — ED Notes (Signed)
Patient c/o sore throat x 2 days. Patient reports she has been having post-nasal drainage. Patient denies any fever. She is in NAD.

## 2016-02-16 NOTE — ED Provider Notes (Signed)
CSN: [id number]     Arrival date & time 02/16/16  1625 History   First MD Initiated Contact with Patient 02/16/16 1731     Chief Complaint  Patient presents with  . Sore Throat   (Consider location/radiation/quality/duration/timing/severity/associated sxs/prior Treatment) HPI  Past Medical History  Diagnosis Date  . Hepatitis C    History reviewed. No pertinent past surgical history. Family History  Problem Relation Age of Onset  . Diabetes Mother   . Cancer Father     colon   Social History  Substance Use Topics  . Smoking status: Current Every Day Smoker  . Smokeless tobacco: Never Used  . Alcohol Use: No   OB History    Gravida Para Term Preterm AB TAB SAB Ectopic Multiple Living   5 4 4  0 1 0 0 0 0 4     Review of Systems  Allergies  Review of patient's allergies indicates no known allergies.  Home Medications   Prior to Admission medications   Medication Sig Start Date End Date Taking? Authorizing Provider  albuterol (PROVENTIL HFA;VENTOLIN HFA) 108 (90 BASE) MCG/ACT inhaler Inhale 2 puffs into the lungs every 4 (four) hours as needed for wheezing or shortness of breath. 04/18/14   Janne Napoleon, NP  amoxicillin (AMOXIL) 250 MG capsule Take 1 capsule (250 mg total) by mouth 2 (two) times daily. 02/16/16   Konrad Felix, PA  chlorpheniramine-HYDROcodone [facility]) 10-8 MG/5ML LQCR Take 5 mLs by mouth 2 (two) times daily. 11/30/14   Blanchie Dessert, MD  methocarbamol (ROBAXIN) 500 MG tablet Take 1 tablet (500 mg total) by mouth 2 (two) times daily. Patient not taking: Reported on 11/30/2014 06/05/14   Domenic Moras, PA-C  methylPREDNISolone (MEDROL DOSEPAK) 4 MG tablet follow package directions Patient not taking: Reported on 11/30/2014 04/18/14   Janne Napoleon, NP  pantoprazole (PROTONIX) 40 MG tablet Take 1 tablet (40 mg total) by mouth daily. Patient not taking: Reported on 11/30/2014 09/30/13   Kingsley Spittle, MD  predniSONE (DELTASONE) 20 MG tablet Take 2 tablets  (40 mg total) by mouth daily. 11/30/14   Blanchie Dessert, MD  vitamin E 100 UNIT capsule Take 100 Units by mouth daily.    Historical Provider, MD   Meds Ordered and Administered this Visit  Medications - No data to display  BP 108/84 mmHg  Pulse 76  Temp(Src) 98.1 F (36.7 C) (Oral)  Resp 16  SpO2 99%  LMP 02/09/2016 No data found.   Physical Exam NURSES NOTES AND VITAL SIGNS REVIEWED. CONSTITUTIONAL: Well developed, well nourished, no acute distress HEENT: normocephalic, atraumatic, throat injected without exudate EYES: Conjunctiva normal NECK:normal ROM, supple, no adenopathy PULMONARY:No respiratory distress, normal effort, Lungs are clear MUSCULOSKELETAL: Normal ROM of all extremities,  SKIN: warm and dry without rash PSYCHIATRIC: Mood and affect, behavior are normal  ED Course  Procedures (including critical care time)  Labs Review Labs Reviewed  CULTURE, GROUP A STREP [facility])  POCT RAPID STREP A    Imaging Review No results found.   Visual Acuity Review  Right Eye Distance:   Left Eye Distance:   Bilateral Distance:    Right Eye Near:   Left Eye Near:    Bilateral Near:       Rx: amoxil  MDM   1. Pharyngitis     Patient is reassured that there are no issues that require transfer to higher level of care at this time or additional tests. Patient is advised to continue home symptomatic treatment.  Patient is advised that if there are new or worsening symptoms to attend the emergency department, contact primary care provider, or return to UC. Instructions of care provided discharged home in stable condition.    THIS NOTE WAS GENERATED USING A VOICE RECOGNITION SOFTWARE PROGRAM. ALL REASONABLE EFFORTS  WERE MADE TO PROOFREAD THIS DOCUMENT FOR ACCURACY.  I have verbally reviewed the discharge instructions with the patient. A printed AVS was given to the patient.  All questions were answered prior to discharge.      Konrad Felix, [place] 02/16/16  [phone]

## 2016-02-19 LAB — CULTURE, GROUP A STREP (THRC)

## 2016-08-02 ENCOUNTER — Other Ambulatory Visit: Payer: Self-pay | Admitting: Nurse Practitioner

## 2016-08-02 DIAGNOSIS — N6310 Unspecified lump in the right breast, unspecified quadrant: Principal | ICD-10-CM

## 2016-08-02 DIAGNOSIS — N6315 Unspecified lump in the right breast, overlapping quadrants: Secondary | ICD-10-CM

## 2016-08-04 ENCOUNTER — Ambulatory Visit
Admission: RE | Admit: 2016-08-04 | Discharge: 2016-08-04 | Disposition: A | Discharge status: Home / Self Care | Payer: Medicaid Other | Source: Ambulatory Visit | Attending: Nurse Practitioner | Admitting: Nurse Practitioner

## 2016-08-04 DIAGNOSIS — N6310 Unspecified lump in the right breast, unspecified quadrant: Principal | ICD-10-CM

## 2016-08-04 DIAGNOSIS — N6315 Unspecified lump in the right breast, overlapping quadrants: Secondary | ICD-10-CM

## 2017-02-17 DIAGNOSIS — H01024 Squamous blepharitis left upper eyelid: Secondary | ICD-10-CM | POA: Diagnosis not present

## 2017-02-17 DIAGNOSIS — H01021 Squamous blepharitis right upper eyelid: Secondary | ICD-10-CM | POA: Diagnosis not present

## 2017-02-17 DIAGNOSIS — H01025 Squamous blepharitis left lower eyelid: Secondary | ICD-10-CM | POA: Diagnosis not present

## 2017-02-17 DIAGNOSIS — H01022 Squamous blepharitis right lower eyelid: Secondary | ICD-10-CM | POA: Diagnosis not present

## 2017-02-17 DIAGNOSIS — H04123 Dry eye syndrome of bilateral lacrimal glands: Secondary | ICD-10-CM | POA: Diagnosis not present

## 2017-07-04 ENCOUNTER — Other Ambulatory Visit: Payer: Self-pay | Admitting: Nurse Practitioner

## 2017-07-04 DIAGNOSIS — Z1231 Encounter for screening mammogram for malignant neoplasm of breast: Secondary | ICD-10-CM

## 2017-08-07 ENCOUNTER — Ambulatory Visit
Admission: RE | Admit: 2017-08-07 | Discharge: 2017-08-07 | Disposition: A | Discharge status: Home / Self Care | Payer: Medicaid Other | Source: Ambulatory Visit | Attending: Nurse Practitioner | Admitting: Nurse Practitioner

## 2017-08-07 DIAGNOSIS — Z1231 Encounter for screening mammogram for malignant neoplasm of breast: Secondary | ICD-10-CM | POA: Diagnosis not present

## 2018-02-19 DIAGNOSIS — I499 Cardiac arrhythmia, unspecified: Secondary | ICD-10-CM | POA: Diagnosis not present

## 2018-07-23 DIAGNOSIS — Z124 Encounter for screening for malignant neoplasm of cervix: Secondary | ICD-10-CM | POA: Diagnosis not present

## 2018-07-27 DIAGNOSIS — T50905A Adverse effect of unspecified drugs, medicaments and biological substances, initial encounter: Secondary | ICD-10-CM | POA: Diagnosis not present

## 2018-07-27 DIAGNOSIS — L298 Other pruritus: Secondary | ICD-10-CM | POA: Diagnosis not present

## 2018-08-03 ENCOUNTER — Other Ambulatory Visit: Payer: Self-pay | Admitting: Physician Assistant

## 2018-08-03 DIAGNOSIS — Z1231 Encounter for screening mammogram for malignant neoplasm of breast: Secondary | ICD-10-CM

## 2018-08-31 DIAGNOSIS — B9689 Other specified bacterial agents as the cause of diseases classified elsewhere: Secondary | ICD-10-CM | POA: Diagnosis not present

## 2018-08-31 DIAGNOSIS — M62838 Other muscle spasm: Secondary | ICD-10-CM | POA: Diagnosis not present

## 2018-08-31 DIAGNOSIS — N898 Other specified noninflammatory disorders of vagina: Secondary | ICD-10-CM | POA: Diagnosis not present

## 2018-08-31 DIAGNOSIS — Z23 Encounter for immunization: Secondary | ICD-10-CM | POA: Diagnosis not present

## 2018-08-31 DIAGNOSIS — N760 Acute vaginitis: Secondary | ICD-10-CM | POA: Diagnosis not present

## 2018-08-31 DIAGNOSIS — N926 Irregular menstruation, unspecified: Secondary | ICD-10-CM | POA: Diagnosis not present

## 2018-08-31 DIAGNOSIS — R221 Localized swelling, mass and lump, neck: Secondary | ICD-10-CM | POA: Diagnosis not present

## 2018-09-05 ENCOUNTER — Ambulatory Visit
Admission: RE | Admit: 2018-09-05 | Discharge: 2018-09-05 | Disposition: A | Discharge status: Home / Self Care | Payer: Medicaid Other | Source: Ambulatory Visit | Attending: Physician Assistant | Admitting: Physician Assistant

## 2018-09-05 DIAGNOSIS — Z1231 Encounter for screening mammogram for malignant neoplasm of breast: Secondary | ICD-10-CM | POA: Diagnosis not present

## 2018-10-04 DIAGNOSIS — D500 Iron deficiency anemia secondary to blood loss (chronic): Secondary | ICD-10-CM | POA: Diagnosis not present

## 2019-01-10 DIAGNOSIS — R0989 Other specified symptoms and signs involving the circulatory and respiratory systems: Secondary | ICD-10-CM | POA: Diagnosis not present

## 2019-01-10 DIAGNOSIS — R079 Chest pain, unspecified: Secondary | ICD-10-CM | POA: Diagnosis not present

## 2019-01-10 DIAGNOSIS — Z23 Encounter for immunization: Secondary | ICD-10-CM | POA: Diagnosis not present

## 2019-04-30 DIAGNOSIS — Z0000 Encounter for general adult medical examination without abnormal findings: Secondary | ICD-10-CM | POA: Diagnosis not present

## 2019-06-10 DIAGNOSIS — Z23 Encounter for immunization: Secondary | ICD-10-CM | POA: Diagnosis not present

## 2019-08-07 DIAGNOSIS — R6882 Decreased libido: Secondary | ICD-10-CM | POA: Diagnosis not present

## 2019-08-07 DIAGNOSIS — G4700 Insomnia, unspecified: Secondary | ICD-10-CM | POA: Diagnosis not present

## 2019-08-07 DIAGNOSIS — Z23 Encounter for immunization: Secondary | ICD-10-CM | POA: Diagnosis not present

## 2019-08-07 DIAGNOSIS — R87612 Low grade squamous intraepithelial lesion on cytologic smear of cervix (LGSIL): Secondary | ICD-10-CM | POA: Diagnosis not present

## 2019-08-08 ENCOUNTER — Other Ambulatory Visit: Payer: Self-pay | Admitting: Physician Assistant

## 2019-08-08 DIAGNOSIS — Z1231 Encounter for screening mammogram for malignant neoplasm of breast: Secondary | ICD-10-CM

## 2019-09-20 ENCOUNTER — Ambulatory Visit
Admission: RE | Admit: 2019-09-20 | Discharge: 2019-09-20 | Disposition: A | Discharge status: Home / Self Care | Payer: Medicaid Other | Source: Ambulatory Visit | Attending: Physician Assistant | Admitting: Physician Assistant

## 2019-09-20 ENCOUNTER — Other Ambulatory Visit: Payer: Self-pay

## 2019-09-20 DIAGNOSIS — Z1231 Encounter for screening mammogram for malignant neoplasm of breast: Secondary | ICD-10-CM | POA: Diagnosis not present

## 2020-05-20 DIAGNOSIS — D500 Iron deficiency anemia secondary to blood loss (chronic): Secondary | ICD-10-CM | POA: Diagnosis not present

## 2020-05-20 DIAGNOSIS — N938 Other specified abnormal uterine and vaginal bleeding: Secondary | ICD-10-CM | POA: Diagnosis not present

## 2020-05-20 DIAGNOSIS — Z0000 Encounter for general adult medical examination without abnormal findings: Secondary | ICD-10-CM | POA: Diagnosis not present

## 2020-05-21 ENCOUNTER — Encounter: Payer: Self-pay | Admitting: Gastroenterology

## 2020-06-05 DIAGNOSIS — D259 Leiomyoma of uterus, unspecified: Secondary | ICD-10-CM | POA: Diagnosis not present

## 2020-06-05 DIAGNOSIS — R9389 Abnormal findings on diagnostic imaging of other specified body structures: Secondary | ICD-10-CM | POA: Diagnosis not present

## 2020-07-01 DIAGNOSIS — R87618 Other abnormal cytological findings on specimens from cervix uteri: Secondary | ICD-10-CM | POA: Diagnosis not present

## 2020-07-01 DIAGNOSIS — D259 Leiomyoma of uterus, unspecified: Secondary | ICD-10-CM | POA: Diagnosis not present

## 2020-07-01 DIAGNOSIS — D500 Iron deficiency anemia secondary to blood loss (chronic): Secondary | ICD-10-CM | POA: Diagnosis not present

## 2020-07-01 DIAGNOSIS — R87612 Low grade squamous intraepithelial lesion on cytologic smear of cervix (LGSIL): Secondary | ICD-10-CM | POA: Diagnosis not present

## 2020-07-02 ENCOUNTER — Ambulatory Visit: Payer: Medicaid Other

## 2020-07-02 ENCOUNTER — Other Ambulatory Visit: Payer: Self-pay

## 2020-07-02 VITALS — Ht 66.0 in | Wt 206.8 lb

## 2020-07-02 DIAGNOSIS — Z1211 Encounter for screening for malignant neoplasm of colon: Secondary | ICD-10-CM

## 2020-07-02 MED ORDER — NA SULFATE-K SULFATE-MG SULF 17.5-3.13-1.6 GM/177ML PO SOLN: 1.0000 | Freq: Once | ORAL | 0 refills | Status: AC

## 2020-07-02 NOTE — Progress Notes (Signed)
Denies allergies to eggs or soy products. Denies complication of anesthesia or sedation. Denies use of weight loss medication. Denies use of O2.   Emmi instructions given for colonoscopy.  Covid vaccination completed 02/22/20.

## 2020-07-16 ENCOUNTER — Other Ambulatory Visit: Payer: Self-pay

## 2020-07-16 ENCOUNTER — Ambulatory Visit (AMBULATORY_SURGERY_CENTER): Payer: Medicaid Other | Admitting: Gastroenterology

## 2020-07-16 ENCOUNTER — Other Ambulatory Visit: Payer: Self-pay | Admitting: Gastroenterology

## 2020-07-16 ENCOUNTER — Encounter: Payer: Self-pay | Admitting: Gastroenterology

## 2020-07-16 VITALS — BP 128/76 | HR 69 | Temp 97.5°F | Resp 23 | Ht 66.0 in | Wt 206.8 lb

## 2020-07-16 DIAGNOSIS — K635 Polyp of colon: Secondary | ICD-10-CM

## 2020-07-16 DIAGNOSIS — Z800 Family history of malignant neoplasm of digestive organs: Secondary | ICD-10-CM | POA: Diagnosis not present

## 2020-07-16 DIAGNOSIS — D123 Benign neoplasm of transverse colon: Secondary | ICD-10-CM

## 2020-07-16 DIAGNOSIS — Z1211 Encounter for screening for malignant neoplasm of colon: Secondary | ICD-10-CM | POA: Diagnosis not present

## 2020-07-16 MED ORDER — SODIUM CHLORIDE 0.9 % IV SOLN: 500.0000 mL | Freq: Once | INTRAVENOUS | Status: DC

## 2020-07-16 NOTE — Patient Instructions (Addendum)
Handout was given to you on polyps. You may resume your current medications today. Await biopsy results. Repeat colonoscopy in 5 years for screening purposes. Please call if any questions or concerns.     YOU HAD AN ENDOSCOPIC PROCEDURE TODAY AT [facility]:   Refer to the procedure report that was given to you for any specific questions about what was found during the examination.  If the procedure report does not answer your questions, please call your gastroenterologist to clarify.  If you requested that your care partner not be given the details of your procedure findings, then the procedure report has been included in a sealed envelope for you to review at your convenience later.  YOU SHOULD EXPECT: Some feelings of bloating in the abdomen. Passage of more gas than usual.  Walking can help get rid of the air that was put into your GI tract during the procedure and reduce the bloating. If you had a lower endoscopy (such as a colonoscopy or flexible sigmoidoscopy) you may notice spotting of blood in your stool or on the toilet paper. If you underwent a bowel prep for your procedure, you may not have a normal bowel movement for a few days.  Please Note:  You might notice some irritation and congestion in your nose or some drainage.  This is from the oxygen used during your procedure.  There is no need for concern and it should clear up in a day or so.  SYMPTOMS TO REPORT IMMEDIATELY:   Following lower endoscopy (colonoscopy or flexible sigmoidoscopy):  Excessive amounts of blood in the stool  Significant tenderness or worsening of abdominal pains  Swelling of the abdomen that is new, acute  Fever of 100F or higher  For urgent or emergent issues, a gastroenterologist can be reached at any hour by calling [phone]. Do not use MyChart messaging for urgent concerns.    DIET:  We do recommend a small meal at first, but then you may proceed to your regular diet.  Drink  plenty of fluids but you should avoid alcoholic beverages for 24 hours.  ACTIVITY:  You should plan to take it easy for the rest of today and you should NOT DRIVE or use heavy machinery until tomorrow (because of the sedation medicines used during the test).    FOLLOW UP: Our staff will call the number listed on your records 48-72 hours following your procedure to check on you and address any questions or concerns that you may have regarding the information given to you following your procedure. If we do not reach you, we will leave a message.  We will attempt to reach you two times.  During this call, we will ask if you have developed any symptoms of COVID 19. If you develop any symptoms (ie: fever, flu-like symptoms, shortness of breath, cough etc.) before then, please call [phone].  If you test positive for Covid 19 in the 2 weeks post procedure, please call and report this information to [place].    If any biopsies were taken you will be contacted by phone or by letter within the next 1-3 weeks.  Please call us at [phone] if you have not heard about the biopsies in 3 weeks.    SIGNATURES/CONFIDENTIALITY: You and/or your care partner have signed paperwork which will be entered into your electronic medical record.  These signatures attest to the fact that that the information above on your After Visit Summary has been reviewed and is  understood.  Full responsibility of the confidentiality of this discharge information lies with you and/or your care-partner.

## 2020-07-16 NOTE — Progress Notes (Signed)
No problems noted in the recovery room. maw 

## 2020-07-16 NOTE — Progress Notes (Signed)
VS- Bethann Berkshire RN  Pt's states no medical or surgical changes since previsit or office visit.

## 2020-07-16 NOTE — Op Note (Signed)
[place] Patient Name: Latoya Klein Procedure Date: 07/16/2020 1:30 PM MRN: [id number] Endoscopist: Mallie Mussel L. Loletha Carrow , MD Age: 49 Referring MD:  Date of Birth: 19-Jan-1971 Gender: Female Account #: [id number] Procedure:                Colonoscopy Indications:              Screening in patient at increased risk: Colorectal                            cancer in father before age 83 Medicines:                Monitored Anesthesia Care Procedure:                Pre-Anesthesia Assessment:                           - Prior to the procedure, a History and Physical                            was performed, and patient medications and                            allergies were reviewed. The patient's tolerance of                            previous anesthesia was also reviewed. The risks                            and benefits of the procedure and the sedation                            options and risks were discussed with the patient.                            All questions were answered, and informed consent                            was obtained. Prior Anticoagulants: The patient has                            taken no previous anticoagulant or antiplatelet                            agents. ASA Grade Assessment: II - A patient with                            mild systemic disease. After reviewing the risks                            and benefits, the patient was deemed in                            satisfactory condition to undergo the procedure.  After obtaining informed consent, the colonoscope                            was passed under direct vision. Throughout the                            procedure, the patient's blood pressure, pulse, and                            oxygen saturations were monitored continuously. The                            Colonoscope was introduced through the anus and                            advanced to the the cecum,  identified by                            appendiceal orifice and ileocecal valve. The                            colonoscopy was performed without difficulty. The                            patient tolerated the procedure well. The quality                            of the bowel preparation was excellent. The                            ileocecal valve, appendiceal orifice, and rectum                            were photographed. Scope In: 1:50:05 PM Scope Out: 2:02:46 PM Scope Withdrawal Time: 0 hours 9 minutes 21 seconds  Total Procedure Duration: 0 hours 12 minutes 41 seconds  Findings:                 The perianal and digital rectal examinations were                            normal.                           A diminutive polyp was found in the transverse                            colon. The polyp was flat. The polyp was removed                            with a cold snare. Resection and retrieval were                            complete.  The exam was otherwise without abnormality on                            direct and retroflexion views. Complications:            No immediate complications. Estimated Blood Loss:     Estimated blood loss was minimal. Impression:               - One diminutive polyp in the transverse colon,                            removed with a cold snare. Resected and retrieved.                           - The examination was otherwise normal on direct                            and retroflexion views. Recommendation:           - Patient has a contact number available for                            emergencies. The signs and symptoms of potential                            delayed complications were discussed with the                            patient. Return to normal activities tomorrow.                            Written discharge instructions were provided to the                            patient.                           -  Resume previous diet.                           - Continue present medications.                           - Await pathology results.                           - Repeat colonoscopy in 5 years for screening                            purposes. Yoni Lobos L. Loletha Carrow, MD 07/16/2020 2:09:53 PM This report has been signed electronically.

## 2020-07-16 NOTE — Progress Notes (Signed)
To PACU VSS. Report to [email or web address] 

## 2020-07-16 NOTE — Progress Notes (Signed)
Called to room to assist during endoscopic procedure.  Patient ID and intended procedure confirmed with present staff. Received instructions for my participation in the procedure from the performing physician.  

## 2020-07-21 ENCOUNTER — Telehealth: Payer: Self-pay | Admitting: *Deleted

## 2020-07-21 DIAGNOSIS — R87612 Low grade squamous intraepithelial lesion on cytologic smear of cervix (LGSIL): Secondary | ICD-10-CM | POA: Diagnosis not present

## 2020-07-21 NOTE — Telephone Encounter (Signed)
  Follow up Call-  Call back number 07/16/2020  Post procedure Call Back phone  # [phone]  Permission to leave phone message Yes  Some recent data might be hidden    LMOM to call back with any questions or concerns.  Also, call back if patient has developed fever, respiratory issues or been dx with COVID or had any family members or close contacts diagnosed since her procedure.

## 2020-07-21 NOTE — Telephone Encounter (Signed)
First follow up call made, left message. 

## 2020-07-23 ENCOUNTER — Encounter: Payer: Self-pay | Admitting: Gastroenterology

## 2020-07-27 ENCOUNTER — Ambulatory Visit: Payer: Medicaid Other | Admitting: Obstetrics and Gynecology

## 2020-07-27 ENCOUNTER — Other Ambulatory Visit: Payer: Self-pay

## 2020-07-27 ENCOUNTER — Other Ambulatory Visit (HOSPITAL_COMMUNITY)
Admission: RE | Admit: 2020-07-27 | Discharge: 2020-07-27 | Disposition: A | Discharge status: Home / Self Care | Payer: Medicaid Other | Source: Ambulatory Visit | Attending: Obstetrics and Gynecology | Admitting: Obstetrics and Gynecology

## 2020-07-27 ENCOUNTER — Encounter: Payer: Self-pay | Admitting: Obstetrics and Gynecology

## 2020-07-27 VITALS — BP 153/92 | HR 75 | Wt 206.0 lb

## 2020-07-27 DIAGNOSIS — N939 Abnormal uterine and vaginal bleeding, unspecified: Secondary | ICD-10-CM | POA: Insufficient documentation

## 2020-07-27 DIAGNOSIS — D259 Leiomyoma of uterus, unspecified: Secondary | ICD-10-CM | POA: Diagnosis not present

## 2020-07-27 LAB — POCT URINE PREGNANCY: Preg Test, Ur: NEGATIVE

## 2020-07-27 MED ORDER — MEGESTROL ACETATE 40 MG PO TABS: 40.0000 mg | ORAL_TABLET | Freq: Two times a day (BID) | ORAL | 5 refills | Status: DC

## 2020-07-27 NOTE — Progress Notes (Signed)
New GYN presents for AUB Pt states cycle this month was 3 days and light.  Pt usually has heavy painful cycles.   Pt up to date on pap, colonoscopy and mammo was in 2020.

## 2020-07-27 NOTE — Patient Instructions (Signed)
Abnormal Uterine Bleeding Abnormal uterine bleeding is unusual bleeding from the uterus. It includes:  Bleeding or spotting between periods.  Bleeding after sex.  Bleeding that is heavier than normal.  Periods that last longer than usual.  Bleeding after menopause. Abnormal uterine bleeding can affect women at various stages in life, including teenagers, women in their reproductive years, pregnant women, and women who have reached menopause. Common causes of abnormal uterine bleeding include:  Pregnancy.  Growths of tissue (polyps).  A noncancerous tumor in the uterus (fibroid).  Infection.  Cancer.  Hormonal imbalances. Any type of abnormal bleeding should be evaluated by a health care provider. Many cases are minor and simple to treat, while others are more serious. Treatment will depend on the cause of the bleeding. Follow these instructions at home:  Monitor your condition for any changes.  Do not use tampons, douche, or have sex if told by your health care provider.  Change your pads often.  Get regular exams that include pelvic exams and cervical cancer screening.  Keep all follow-up visits as told by your health care provider. This is important. Contact a health care provider if:  Your bleeding lasts for more than one week.  You feel dizzy at times.  You feel nauseous or you vomit. Get help right away if:  You pass out.  Your bleeding soaks through a pad every hour.  You have abdominal pain.  You have a fever.  You become sweaty or weak.  You pass large blood clots from your vagina. Summary  Abnormal uterine bleeding is unusual bleeding from the uterus.  Any type of abnormal bleeding should be evaluated by a health care provider. Many cases are minor and simple to treat, while others are more serious.  Treatment will depend on the cause of the bleeding. This information is not intended to replace advice given to you by your health care provider.  Make sure you discuss any questions you have with your health care provider. Document Revised: 02/07/2018 Document Reviewed: 12/02/2016 Elsevier Patient Education  2020 [employer].  Endometrial Ablation Endometrial ablation is a procedure that destroys the thin inner layer of the lining of the uterus (endometrium). This procedure may be done:  To stop heavy periods.  To stop bleeding that is causing anemia.  To control irregular bleeding.  To treat bleeding caused by small tumors (fibroids) in the endometrium. This procedure is often an alternative to major surgery, such as removal of the uterus and cervix (hysterectomy). As a result of this procedure:  You may not be able to have children. However, if you are premenopausal (you have not gone through menopause): ? You may still have a small chance of getting pregnant. ? You will need to use a reliable method of birth control after the procedure to prevent pregnancy.  You may stop having a menstrual period, or you may have only a small amount of bleeding during your period. Menstruation may return several years after the procedure. Tell a health care provider about:  Any allergies you have.  All medicines you are taking, including vitamins, herbs, eye drops, creams, and over-the-counter medicines.  Any problems you or family members have had with the use of anesthetic medicines.  Any blood disorders you have.  Any surgeries you have had.  Any medical conditions you have. What are the risks? Generally, this is a safe procedure. However, problems may occur, including:  A hole (perforation) in the uterus or bowel.  Infection of the uterus,  bladder, or vagina.  Bleeding.  Damage to other structures or organs.  An air bubble in the lung (air embolus).  Problems with pregnancy after the procedure.  Failure of the procedure.  Decreased ability to diagnose cancer in the endometrium. What happens before the  procedure?  You will have tests of your endometrium to make sure there are no pre-cancerous cells or cancer cells present.  You may have an ultrasound of the uterus.  You may be given medicines to thin the endometrium.  Ask your health care provider about: ? Changing or stopping your regular medicines. This is especially important if you take diabetes medicines or blood thinners. ? Taking medicines such as aspirin and ibuprofen. These medicines can thin your blood. Do not take these medicines before your procedure if your doctor tells you not to.  Plan to have someone take you home from the hospital or clinic. What happens during the procedure?   You will lie on an exam table with your feet and legs supported as in a pelvic exam.  To lower your risk of infection: ? Your health care team will wash or sanitize their hands and put on germ-free (sterile) gloves. ? Your genital area will be washed with soap.  An IV tube will be inserted into one of your veins.  You will be given a medicine to help you relax (sedative).  A surgical instrument with a light and camera (resectoscope) will be inserted into your vagina and moved into your uterus. This allows your surgeon to see inside your uterus.  Endometrial tissue will be removed using one of the following methods: ? Radiofrequency. This method uses a radiofrequency-alternating electric current to remove the endometrium. ? Cryotherapy. This method uses extreme cold to freeze the endometrium. ? Heated-free liquid. This method uses a heated saltwater (saline) solution to remove the endometrium. ? Microwave. This method uses high-energy microwaves to heat up the endometrium and remove it. ? Thermal balloon. This method involves inserting a catheter with a balloon tip into the uterus. The balloon tip is filled with heated fluid to remove the endometrium. The procedure may vary among health care providers and hospitals. What happens after the  procedure?  Your blood pressure, heart rate, breathing rate, and blood oxygen level will be monitored until the medicines you were given have worn off.  As tissue healing occurs, you may notice vaginal bleeding for 4-6 weeks after the procedure. You may also experience: ? Cramps. ? Thin, watery vaginal discharge that is light pink or brown in color. ? A need to urinate more frequently than usual. ? Nausea.  Do not drive for 24 hours if you were given a sedative.  Do not have sex or insert anything into your vagina until your health care provider approves. Summary  Endometrial ablation is done to treat the many causes of heavy menstrual bleeding.  The procedure may be done only after medications have been tried to control the bleeding.  Plan to have someone take you home from the hospital or clinic. This information is not intended to replace advice given to you by your health care provider. Make sure you discuss any questions you have with your health care provider. Document Revised: 04/17/2018 Document Reviewed: 11/17/2016 Elsevier Patient Education  [employer].   Hysterectomy Information  A hysterectomy is a surgery in which the uterus is removed. The fallopian tubes and ovaries may be removed (bilateral salpingo-oophorectomy) as well. This procedure may be done to treat various medical  problems. After the procedure, a woman will no longer have menstrual periods nor will she be able to become pregnant (sterile). What are the reasons for a hysterectomy? There are many reasons why a woman might have this procedure. They include:  Persistent, abnormal vaginal bleeding.  Long-term (chronic) pelvic pain or infection.  Endometriosis. This is when the lining of the uterus (endometrium) starts to grow outside the uterus.  Adenomyosis. This is when the endometrium starts to grow in the muscle of the uterus.  Pelvic organ prolapse. This is a condition in which the uterus falls  down into the vagina.  Noncancerous growths in the uterus (uterine fibroids) that cause symptoms.  The presence of precancerous cells.  Cervical or uterine cancer. What are the different types of hysterectomy? There are three different types of hysterectomy:  Supracervical hysterectomy. In this type, the top part of the uterus is removed, but not the cervix.  Total hysterectomy. In this type, the uterus and cervix are removed.  Radical hysterectomy. In this type, the uterus, the cervix, and the tissue that holds the uterus in place (parametrium) are removed. What are the different ways a hysterectomy can be performed? There are many different ways a hysterectomy can be performed, including:  Abdominal hysterectomy. In this type, an incision is made in the abdomen. The uterus is removed through this incision.  Vaginal hysterectomy. In this type, an incision is made in the vagina. The uterus is removed through this incision. There are no abdominal incisions.  Conventional laparoscopic hysterectomy. In this type, three or four small incisions are made in the abdomen. A thin, lighted tube with a camera (laparoscope) is inserted into one of the incisions. Other tools are put through the other incisions. The uterus is cut into small pieces. The small pieces are removed through the incisions or through the vagina.  Laparoscopically assisted vaginal hysterectomy (LAVH). In this type, three or four small incisions are made in the abdomen. Part of the surgery is performed laparoscopically and the other part is done vaginally. The uterus is removed through the vagina.  Robot-assisted laparoscopic hysterectomy. In this type, a laparoscope and other tools are inserted into three or four small incisions in the abdomen. A computer-controlled device is used to give the surgeon a 3D image and to help control the surgical instruments. This allows for more precise movements of surgical instruments. The uterus  is cut into small pieces and removed through the incisions or removed through the vagina. Discuss the options with your health care provider to determine which type is the right one for you. What are the risks? Generally, this is a safe procedure. However, problems may occur, including:  Bleeding and risk of blood transfusion. Tell your health care provider if you do not want to receive any blood products.  Blood clots in the legs or lung.  Infection.  Damage to other structures or organs.  Allergic reactions to medicines.  Changing to an abdominal hysterectomy from one of the other techniques. What to expect after a hysterectomy  You will be given pain medicine.  You may need to stay in the hospital for 1- 2 days to recover, depending on the type of hysterectomy you had.  Follow your health care provider's instructions about exercise, driving, and general activities. Ask your health care provider what activities are safe for you.  You will need to have someone with you for the first 3-5 days after you go home.  You will need to follow up  with your surgeon in 2-4 weeks after surgery to evaluate your progress.  If the ovaries are removed, you will have early menopause symptoms such as hot flashes, night sweats, and insomnia.  If you had a hysterectomy for a problem that was not cancer or not a condition that could lead to cancer, then you no longer need Pap tests. However, even if you no longer need a Pap test, a regular pelvic exam is a good idea to make sure no other problems are developing. Questions to ask your health care provider  Is a hysterectomy medically necessary? Do I have other treatment options for my condition?  What are my options for hysterectomy procedure?  What organs and tissues need to be removed?  What are the risks?  What are the benefits?  How long will I need to stay in the hospital after the procedure?  How long will I need to recover at  home?  What symptoms can I expect after the procedure? Summary  A hysterectomy is a surgery in which the uterus is removed. The fallopian tubes and ovaries may be removed (bilateral salpingo-oophorectomy) as well.  This procedure may be done to treat various medical problems. After the procedure, a woman will no longer have menstrual periods nor will she be able to become pregnant.  Discuss the options with your health care provider to determine which type of hysterectomy is the right one for you. This information is not intended to replace advice given to you by your health care provider. Make sure you discuss any questions you have with your health care provider. Document Revised: 10/13/2017 Document Reviewed: 12/07/2016 Elsevier Patient Education  2020 [employer].

## 2020-07-27 NOTE — Progress Notes (Signed)
49 yo P4 presenting today for the evaluation of abnormal uterine bleeding. Patient reports a monthly period lasting 10-14 days heavy in flow. She reports anemia as a result of her menorrhagia. Patient with known fibroid uterus. She denies chest pain, SOB, lightheadedness/dizziness. Patient is not sexually active. She denies pelvic pain or abnormal discharge.  Past Medical History:  Diagnosis Date  . Allergy   . Anemia   . GERD (gastroesophageal reflux disease)   . Hepatitis C   . Hypertension   . Substance abuse ([place])    Drugs in the past. No drugs in the past 10 years,  . Vaginal Pap smear, abnormal    History reviewed. No pertinent surgical history. Family History  Problem Relation Age of Onset  . Diabetes Mother   . Cancer Father        colon  . Colon cancer Father        dx age 73's  . Breast cancer Neg Hx   . Esophageal cancer Neg Hx   . Rectal cancer Neg Hx   . Stomach cancer Neg Hx    Social History   Tobacco Use  . Smoking status: Current Every Day Smoker    Packs/day: 0.50  . Smokeless tobacco: Never Used  . Tobacco comment: 10 cigs/day  Vaping Use  . Vaping Use: Never used  Substance Use Topics  . Alcohol use: No  . Drug use: No   ROS See pertinent in HPI. All other systems reviewed and negative  Blood pressure (!) 153/92, pulse 75, weight 206 lb (93.4 kg), last menstrual period 07/17/2020.  GENERAL: Well-developed, well-nourished female in no acute distress.  HEENT: Normocephalic, atraumatic. Sclerae anicteric.  NECK: Supple. Normal thyroid.  LUNGS: Clear to auscultation bilaterally.  HEART: Regular rate and rhythm. BREASTS: Symmetric in size. No palpable masses or lymphadenopathy, skin changes, or nipple drainage. ABDOMEN: Soft, nontender, nondistended. No organomegaly. PELVIC: Normal external female genitalia. Vagina is pink and rugated.  Normal discharge. Normal appearing cervix. Uterus is normal in size. No adnexal mass or tenderness. EXTREMITIES: No  cyanosis, clubbing, or edema, 2+ distal pulses.  05/2020 ultrasound Shara Blazing, MD - 06/05/2020  Formatting of this note might be different from the original.  INDICATION:Vaginal bleeding   COMPARISON: None.   TECHNIQUE: Real-time sonographic evaluation of the pelvis was performed via a transabdominal and endovaginal approach by ultrasound technologist and multiple images were saved for interpretation.   FINDINGS:  Uterus: Measures 13.3 x 7.1 x 7.5 cm. Two large fibroids are present measuring 4 cm and 3.4 cm.  Endometrial stripe: Measures 1 cm.  Free fluid: None.  Ovaries: Right ovary measures 3.9 x 2.3 x 2.1 cm. Left ovary measures 2.3 x 1.9 x 1.8 cm. Both ovaries demonstrate multiple follicles.  Adnexa: No mass.    IMPRESSION:  1. Two large uterine fibroids, measuring up to 4 cm and 3.4 cm.   2. Mildly thickened endometrial stripe measuring 1 cm, which may be secondary to the secretary phase of menstruation.   3. Additional findings as detailed above.   Electronically Signed by: Shara Blazing Specimen Collected: 06/05/20 10:47 AM Last Resulted: 06/05/20 10:49 AM  Received From: [facility]      A/P 49 yo with menorrhagia with regular cycles and fibroid uterus - Discussed benefits of endometrial biopsy ENDOMETRIAL BIOPSY     The indications for endometrial biopsy were reviewed.   Risks of the biopsy including cramping, bleeding, infection, uterine perforation, inadequate specimen and need for additional procedures  were discussed. The patient states she understands and agrees to undergo procedure today. Consent was signed. Time out was performed. Urine HCG was negative. A sterile speculum was placed in the patient's vagina and the cervix was prepped with Betadine. A single-toothed tenaculum was placed on the anterior lip of the cervix to stabilize it. The uterine cavity was sounded to a depth of 12 cm using the uterine sound. The 3 mm pipelle was introduced into the  endometrial cavity without difficulty, 2 passes were made.  A  moderate amount of tissue was  sent to pathology. The instruments were removed from the patient's vagina. Minimal bleeding from the cervix was noted. The patient tolerated the procedure well.  Routine post-procedure instructions were given to the patient.   - Discussed medical management with megace vs surgical management with endometrial ablation or hysterectomy pending endometrial biopsy - Patient with LGSIL on pap and recently had colposcopy - Patient up today on colonoscopy - Patient due for mammogram 09/2020 - Patient will be contacted with results of endometrial biopsy and further management as indicated

## 2020-07-28 LAB — CBC
Hematocrit: 32.6 % — ABNORMAL LOW (ref 34.0–46.6)
Hemoglobin: 10.4 g/dL — ABNORMAL LOW (ref 11.1–15.9)
MCH: 24.5 pg — ABNORMAL LOW (ref 26.6–33.0)
MCHC: 31.9 g/dL (ref 31.5–35.7)
MCV: 77 fL — ABNORMAL LOW (ref 79–97)
Platelets: 465 10*3/uL — ABNORMAL HIGH (ref 150–450)
RBC: 4.25 x10E6/uL (ref 3.77–5.28)
RDW: 23.8 % — ABNORMAL HIGH (ref 11.7–15.4)
WBC: 8.9 10*3/uL (ref 3.4–10.8)

## 2020-07-29 LAB — SURGICAL PATHOLOGY

## 2020-08-08 ENCOUNTER — Emergency Department (HOSPITAL_COMMUNITY): Payer: Medicaid Other

## 2020-08-08 ENCOUNTER — Other Ambulatory Visit: Payer: Self-pay

## 2020-08-08 ENCOUNTER — Encounter (HOSPITAL_COMMUNITY): Payer: Self-pay | Admitting: Emergency Medicine

## 2020-08-08 ENCOUNTER — Emergency Department (HOSPITAL_COMMUNITY)
Admission: EM | Admit: 2020-08-08 | Discharge: 2020-08-08 | Disposition: A | Discharge status: Home / Self Care | Payer: Medicaid Other | Attending: Emergency Medicine | Admitting: Emergency Medicine

## 2020-08-08 DIAGNOSIS — I10 Essential (primary) hypertension: Secondary | ICD-10-CM | POA: Diagnosis not present

## 2020-08-08 DIAGNOSIS — Z79899 Other long term (current) drug therapy: Secondary | ICD-10-CM | POA: Diagnosis not present

## 2020-08-08 DIAGNOSIS — Z20822 Contact with and (suspected) exposure to covid-19: Secondary | ICD-10-CM | POA: Insufficient documentation

## 2020-08-08 DIAGNOSIS — R519 Headache, unspecified: Secondary | ICD-10-CM | POA: Diagnosis not present

## 2020-08-08 DIAGNOSIS — R0789 Other chest pain: Secondary | ICD-10-CM | POA: Diagnosis not present

## 2020-08-08 DIAGNOSIS — F17200 Nicotine dependence, unspecified, uncomplicated: Secondary | ICD-10-CM | POA: Insufficient documentation

## 2020-08-08 LAB — CBC
HCT: 32.3 % — ABNORMAL LOW (ref 36.0–46.0)
Hemoglobin: 10.1 g/dL — ABNORMAL LOW (ref 12.0–15.0)
MCH: 23.9 pg — ABNORMAL LOW (ref 26.0–34.0)
MCHC: 31.3 g/dL (ref 30.0–36.0)
MCV: 76.4 fL — ABNORMAL LOW (ref 80.0–100.0)
Platelets: 490 10*3/uL — ABNORMAL HIGH (ref 150–400)
RBC: 4.23 MIL/uL (ref 3.87–5.11)
RDW: 21.7 % — ABNORMAL HIGH (ref 11.5–15.5)
WBC: 6.9 10*3/uL (ref 4.0–10.5)
nRBC: 0 % (ref 0.0–0.2)

## 2020-08-08 LAB — BASIC METABOLIC PANEL
Anion gap: 9 (ref 5–15)
BUN: 8 mg/dL (ref 6–20)
CO2: 24 mmol/L (ref 22–32)
Calcium: 9 mg/dL (ref 8.9–10.3)
Chloride: 106 mmol/L (ref 98–111)
Creatinine, Ser: 1.05 mg/dL — ABNORMAL HIGH (ref 0.44–1.00)
GFR calc Af Amer: >60 mL/min (ref >60)
GFR calc non Af Amer: >60 mL/min (ref >60)
Glucose, Bld: 107 mg/dL — ABNORMAL HIGH (ref 70–99)
Potassium: 4.4 mmol/L (ref 3.5–5.1)
Sodium: 139 mmol/L (ref 135–145)

## 2020-08-08 LAB — RESPIRATORY PANEL BY RT PCR (FLU A&B, COVID)
Influenza A by PCR: NEGATIVE
Influenza B by PCR: NEGATIVE
SARS Coronavirus 2 by RT PCR: NEGATIVE

## 2020-08-08 LAB — TROPONIN I (HIGH SENSITIVITY)
Troponin I (High Sensitivity): 3 ng/L (ref <18)
Troponin I (High Sensitivity): 3 ng/L (ref <18)

## 2020-08-08 NOTE — Discharge Instructions (Addendum)
Take your medications as prescribed. Follow-up with your PCP next week.  Return to ER for worsening or concerning symptoms.

## 2020-08-08 NOTE — ED Triage Notes (Signed)
Pt. Stated, I started having chest pain 5 days ago and some dizziness.

## 2020-08-08 NOTE — ED Provider Notes (Signed)
[facility] EMERGENCY DEPARTMENT Provider Note   CSN: [id number] Arrival date & time: 08/08/20  0630     History Chief Complaint  Patient presents with  . Chest Pain  . Dizziness    Latoya Klein is a 49 y.o. female.  49 year old female with history of hypertension presents with complaint of chest discomfort with feeling lightheaded and headache. Patient came to the ER today because she woke up at 1:00 in the morning and was sweaty and felt lightheaded. Patient states that symptoms started 5 days ago. States that she was taking her blood pressure medication at night however when her symptoms started she noticed that her blood pressure was running high (systolic blood pressure of 147) so she then started taking her blood pressure medication in the morning followed by a second dose in the afternoon when her blood pressure was high and she felt unwell. Patient states symptoms lasted for few hours and resolve with rest. Pain is described as a left upper chest discomfort that then moves behind her left eye without visual disturbance. No history of headaches previously, no cardiac history, no family history. Patient quit smoking 2 days ago. No history of hyperlipidemia or diabetes. Denies shortness of breath, abdominal pain, nausea, vomiting, unilateral weakness or numbness. Denies sick contacts or recent illness. No other complaints or concerns today.        Past Medical History:  Diagnosis Date  . Allergy   . Anemia   . GERD (gastroesophageal reflux disease)   . Hepatitis C   . Hypertension   . Substance abuse ([place])    Drugs in the past. No drugs in the past 10 years,  . Vaginal Pap smear, abnormal     Patient Active Problem List   Diagnosis Date Noted  . Vaginal ulcer 07/09/2012  . Vaginitis 05/26/2011    History reviewed. No pertinent surgical history.   OB History    Gravida  5   Para  4   Term  4   Preterm  0   AB  1   Living  4      SAB  0   TAB  0   Ectopic  0   Multiple  0   Live Births  4           Family History  Problem Relation Age of Onset  . Diabetes Mother   . Cancer Father        colon  . Colon cancer Father        dx age 74's  . Breast cancer Neg Hx   . Esophageal cancer Neg Hx   . Rectal cancer Neg Hx   . Stomach cancer Neg Hx     Social History   Tobacco Use  . Smoking status: Current Every Day Smoker    Packs/day: 0.50  . Smokeless tobacco: Never Used  . Tobacco comment: 10 cigs/day  Vaping Use  . Vaping Use: Never used  Substance Use Topics  . Alcohol use: No  . Drug use: No    Home Medications Prior to Admission medications   Medication Sig Start Date End Date Taking? Authorizing Provider  albuterol (PROVENTIL HFA;VENTOLIN HFA) 108 (90 BASE) MCG/ACT inhaler Inhale 2 puffs into the lungs every 4 (four) hours as needed for wheezing or shortness of breath. 04/18/14  Yes Mabe, Shanon Brow, NP  ibuprofen (ADVIL) 200 MG tablet Take 200-600 mg by mouth every 6 (six) hours as needed for mild pain or moderate  pain.   Yes [provider]  lisinopril (ZESTRIL) 10 MG tablet Take 10 mg by mouth daily.   Yes [provider]  loratadine (CLARITIN) 10 MG tablet Take 10 mg by mouth daily as needed for allergies.   Yes [provider]  megestrol (MEGACE) 40 MG tablet Take 1 tablet (40 mg total) by mouth 2 (two) times daily. Can increase to two tablets twice a day in the event of heavy bleeding Patient taking differently: Take 40-80 mg by mouth 2 (two) times daily as needed (heavy bleeding).  07/27/20  Yes Constant, Peggy, MD  pantoprazole (PROTONIX) 40 MG tablet Take 1 tablet (40 mg total) by mouth daily. Patient taking differently: Take 40 mg by mouth daily as needed (heartburn).  09/30/13  Yes Kingsley Spittle, MD  valACYclovir (VALTREX) 500 MG tablet Take 500 mg by mouth daily.   Yes [provider]    Allergies    Patient has no known allergies.  Review of  Systems   Review of Systems  Constitutional: Positive for diaphoresis. Negative for chills, fatigue and fever.  HENT: Negative for congestion, sinus pressure, sinus pain and sore throat.   Eyes: Negative for visual disturbance.  Respiratory: Negative for cough, chest tightness and shortness of breath.   Cardiovascular: Positive for chest pain.  Gastrointestinal: Negative for nausea and vomiting.  Musculoskeletal: Negative for gait problem.  Skin: Negative for rash and wound.  Allergic/Immunologic: Negative for immunocompromised state.  Neurological: Positive for light-headedness and headaches. Negative for speech difficulty and weakness.  Psychiatric/Behavioral: Negative for confusion.  All other systems reviewed and are negative.   Physical Exam Updated Vital Signs BP (!) 149/91 (BP Location: Right Arm)   Pulse (!) 52   Temp 98.2 F (36.8 C) (Oral)   Resp 18   Ht 5\' 4"  (1.626 m)   Wt 94.3 kg   LMP 07/17/2020   SpO2 98%   BMI 35.70 kg/m   Physical Exam Vitals and nursing note reviewed.  Constitutional:      General: She is not in acute distress.    Appearance: She is well-developed. She is not diaphoretic.  HENT:     Head: Normocephalic and atraumatic.  Eyes:     Extraocular Movements: Extraocular movements intact.     Pupils: Pupils are equal, round, and reactive to light.  Cardiovascular:     Rate and Rhythm: Normal rate and regular rhythm.     Heart sounds: Normal heart sounds. No murmur heard.   Pulmonary:     Effort: Pulmonary effort is normal.     Breath sounds: Normal breath sounds. No decreased breath sounds.  Chest:     Chest wall: No tenderness.  Abdominal:     Palpations: Abdomen is soft.     Tenderness: There is no abdominal tenderness.  Musculoskeletal:     Right lower leg: No edema.     Left lower leg: No edema.  Skin:    General: Skin is warm and dry.     Findings: No erythema or rash.  Neurological:     Mental Status: She is alert and  oriented to person, place, and time.  Psychiatric:        Behavior: Behavior normal.     ED Results / Procedures / Treatments   Labs (all labs ordered are listed, but only abnormal results are displayed) Labs Reviewed  BASIC METABOLIC PANEL - Abnormal; Notable for the following components:      Result Value   Glucose, Bld  107 (*)    Creatinine, Ser 1.05 (*)    All other components within normal limits  CBC - Abnormal; Notable for the following components:   Hemoglobin 10.1 (*)    HCT 32.3 (*)    MCV 76.4 (*)    MCH 23.9 (*)    RDW 21.7 (*)    Platelets 490 (*)    All other components within normal limits  RESPIRATORY PANEL BY RT PCR (FLU A&B, COVID)  I-STAT BETA HCG BLOOD, ED ([facility], [facility], [facility] ONLY)  TROPONIN I (HIGH SENSITIVITY)  TROPONIN I (HIGH SENSITIVITY)    EKG EKG Interpretation  Date/Time:  Saturday August 08 2020 09:03:13 EDT Ventricular Rate:  68 PR Interval:  158 QRS Duration: 70 QT Interval:  414 QTC Calculation: 440 R Axis:   45 Text Interpretation: Normal sinus rhythm Cannot rule out Anterior infarct , age undetermined Abnormal ECG Confirmed by Madalyn Rob [phone]) on 08/08/2020 9:37:57 AM   Radiology DG Chest 2 View  Result Date: 08/08/2020 CLINICAL DATA:  Acute chest pain for 5 days EXAM: CHEST - 2 VIEW COMPARISON:  11/30/2014 FINDINGS: The cardiomediastinal silhouette is unremarkable. There is no evidence of focal airspace disease, pulmonary edema, suspicious pulmonary nodule/mass, pleural effusion, or pneumothorax. No acute bony abnormalities are identified. IMPRESSION: No active cardiopulmonary disease. Electronically Signed   By: Margarette Canada M.D.   On: 08/08/2020 09:52    Procedures Procedures (including critical care time)  Medications Ordered in ED Medications - No data to display  ED Course  I have reviewed the triage vital signs and the nursing notes.  Pertinent labs & imaging results that were available during my care of the patient  were reviewed by me and considered in my medical decision making (see chart for details).  Clinical Course as of Aug 08 1314  Sat Sep 25, 951  [id number] 49 year old female with presentation as above. On exam, patient is well appearing, no chest wall tenderness, lungs CTA, HRRR, abdomen soft and non tender. CBC without significant change from baseline known anemia, BMP without significant findings. Troponin 3 on two checks with hear score of 4. Covid negative. CXR unremarkable, EKG without ischemic changes. Recommend take her BP med at night as prescribed and follow up with her PCP.    [LM]    Clinical Course User Index [LM] Roque Lias   MDM Rules/Calculators/A&P                          Final Clinical Impression(s) / ED Diagnoses Final diagnoses:  Atypical chest pain    Rx / DC Orders ED Discharge Orders    None       Tacy Learn, PA-C 08/08/20 1315    Lucrezia Starch, MD 08/08/20 1327

## 2020-08-08 NOTE — ED Notes (Signed)
Patient transported to X-ray 

## 2020-08-10 ENCOUNTER — Telehealth: Payer: Self-pay

## 2020-08-10 NOTE — Telephone Encounter (Signed)
Transition Care Management Follow-up Telephone Call  Date of discharge and from where: 08/08/2020 from [facility]  How have you been since you were released from the hospital? Patient states that she is feeling much better.   Any questions or concerns? No  Items Reviewed:  Did the pt receive and understand the discharge instructions provided? Yes   Medications obtained and verified? Yes   Any new allergies since your discharge? Yes   Dietary orders reviewed? Yes  Do you have support at home? Yes   Functional Questionnaire: (I = Independent and D = Dependent) ADLs: I Bathing/Dressing- I Meal Prep- I Eating- I Maintaining continence- I Transferring/Ambulation- I Managing Meds- I  Follow up appointments reviewed:   PCP Hospital f/u appt confirmed? Patient declined to make an appointment at this time.   Are transportation arrangements needed? No   If their condition worsens, is the pt aware to call PCP or go to the Emergency Dept.? Yes  Was the patient provided with contact information for the PCP's office or ED? Yes  Was to pt encouraged to call back with questions or concerns? Yes

## 2020-08-11 ENCOUNTER — Telehealth: Payer: Self-pay | Admitting: *Deleted

## 2020-08-11 NOTE — Telephone Encounter (Signed)
Called spoke with patient ,she will make a follow up appointment with her PCP . Nelli Swalley  PEC [phone]

## 2020-08-13 DIAGNOSIS — I10 Essential (primary) hypertension: Secondary | ICD-10-CM | POA: Diagnosis not present

## 2020-08-13 DIAGNOSIS — Z23 Encounter for immunization: Secondary | ICD-10-CM | POA: Diagnosis not present

## 2020-08-27 DIAGNOSIS — N938 Other specified abnormal uterine and vaginal bleeding: Secondary | ICD-10-CM | POA: Diagnosis not present

## 2020-08-27 DIAGNOSIS — Z23 Encounter for immunization: Secondary | ICD-10-CM | POA: Diagnosis not present

## 2020-08-27 DIAGNOSIS — D259 Leiomyoma of uterus, unspecified: Secondary | ICD-10-CM | POA: Diagnosis not present

## 2020-08-27 DIAGNOSIS — I10 Essential (primary) hypertension: Secondary | ICD-10-CM | POA: Diagnosis not present

## 2020-09-21 ENCOUNTER — Ambulatory Visit (INDEPENDENT_AMBULATORY_CARE_PROVIDER_SITE_OTHER): Payer: Medicaid Other | Admitting: Obstetrics and Gynecology

## 2020-09-21 ENCOUNTER — Other Ambulatory Visit: Payer: Self-pay

## 2020-09-21 ENCOUNTER — Encounter: Payer: Self-pay | Admitting: Obstetrics and Gynecology

## 2020-09-21 VITALS — BP 140/89 | HR 87 | Wt 208.0 lb

## 2020-09-21 DIAGNOSIS — N939 Abnormal uterine and vaginal bleeding, unspecified: Secondary | ICD-10-CM

## 2020-09-21 MED ORDER — MEGESTROL ACETATE 40 MG PO TABS
40.0000 mg | ORAL_TABLET | Freq: Two times a day (BID) | ORAL | 5 refills | Status: DC
Start: 2020-09-21 — End: 2022-03-25

## 2020-09-21 NOTE — Patient Instructions (Addendum)
Endometrial Ablation  Endometrial ablation is a procedure that destroys the thin inner layer of the lining of the uterus (endometrium). This procedure may be done:  To stop heavy periods.  To stop bleeding that is causing anemia.  To control irregular bleeding.  To treat bleeding caused by small tumors (fibroids) in the endometrium.  This procedure is often an alternative to major surgery, such as removal of the uterus and cervix (hysterectomy). As a result of this procedure:  You may not be able to have children. However, if you are premenopausal (you have not gone through menopause):  You may still have a small chance of getting pregnant.  You will need to use a reliable method of birth control after the procedure to prevent pregnancy.  You may stop having a menstrual period, or you may have only a small amount of bleeding during your period. Menstruation may return several years after the procedure.  Tell a health care provider about:  Any allergies you have.  All medicines you are taking, including vitamins, herbs, eye drops, creams, and over-the-counter medicines.  Any problems you or family members have had with the use of anesthetic medicines.  Any blood disorders you have.  Any surgeries you have had.  Any medical conditions you have.  What are the risks?  Generally, this is a safe procedure. However, problems may occur, including:  A hole (perforation) in the uterus or bowel.  Infection of the uterus, bladder, or vagina.  Bleeding.  Damage to other structures or organs.  An air bubble in the lung (air embolus).  Problems with pregnancy after the procedure.  Failure of the procedure.  Decreased ability to diagnose cancer in the endometrium.  What happens before the procedure?  You will have tests of your endometrium to make sure there are no pre-cancerous cells or cancer cells present.  You may have an ultrasound of the uterus.  You may be given medicines to thin the endometrium.  Ask your health care  provider about:  Changing or stopping your regular medicines. This is especially important if you take diabetes medicines or blood thinners.  Taking medicines such as aspirin and ibuprofen. These medicines can thin your blood. Do not take these medicines before your procedure if your doctor tells you not to.  Plan to have someone take you home from the hospital or clinic.  What happens during the procedure?    You will lie on an exam table with your feet and legs supported as in a pelvic exam.  To lower your risk of infection:  Your health care team will wash or sanitize their hands and put on germ-free (sterile) gloves.  Your genital area will be washed with soap.  An IV tube will be inserted into one of your veins.  You will be given a medicine to help you relax (sedative).  A surgical instrument with a light and camera (resectoscope) will be inserted into your vagina and moved into your uterus. This allows your surgeon to see inside your uterus.  Endometrial tissue will be removed using one of the following methods:  Radiofrequency. This method uses a radiofrequency-alternating electric current to remove the endometrium.  Cryotherapy. This method uses extreme cold to freeze the endometrium.  Heated-free liquid. This method uses a heated saltwater (saline) solution to remove the endometrium.  Microwave. This method uses high-energy microwaves to heat up the endometrium and remove it.  Thermal balloon. This method involves inserting a catheter with a balloon tip into   the uterus. The balloon tip is filled with heated fluid to remove the endometrium.  The procedure may vary among health care providers and hospitals.  What happens after the procedure?  Your blood pressure, heart rate, breathing rate, and blood oxygen level will be monitored until the medicines you were given have worn off.  As tissue healing occurs, you may notice vaginal bleeding for 4-6 weeks after the procedure. You may also  experience:  Cramps.  Thin, watery vaginal discharge that is light pink or brown in color.  A need to urinate more frequently than usual.  Nausea.  Do not drive for 24 hours if you were given a sedative.  Do not have sex or insert anything into your vagina until your health care provider approves.  Summary  Endometrial ablation is done to treat the many causes of heavy menstrual bleeding.  The procedure may be done only after medications have been tried to control the bleeding.  Plan to have someone take you home from the hospital or clinic.  This information is not intended to replace advice given to you by your health care provider. Make sure you discuss any questions you have with your health care provider.  Document Revised: 04/17/2018 Document Reviewed: 11/17/2016  Elsevier Patient Education  2020 [employer] Inc[employer]

## 2020-09-21 NOTE — Progress Notes (Signed)
49 yo P4 presenting today to follow up on AUB. Patient was seen in September 2021 for this problem. She had a negative endometrial biopsy and was treated with megace. Patient reports improvement in her vaginal bleeding in the sense that her bleeding is not as heavy as previously, however, she continues to bleed for 10-14 days. Patient is ready for further intervention  Past Medical History:  Diagnosis Date  . Allergy   . Anemia   . GERD (gastroesophageal reflux disease)   . Hepatitis C   . Hypertension   . Substance abuse ([place])    Drugs in the past. No drugs in the past 10 years,  . Vaginal Pap smear, abnormal    No past surgical history on file. Family History  Problem Relation Age of Onset  . Diabetes Mother   . Cancer Father        colon  . Colon cancer Father        dx age 71's  . Breast cancer Neg Hx   . Esophageal cancer Neg Hx   . Rectal cancer Neg Hx   . Stomach cancer Neg Hx    Social History   Tobacco Use  . Smoking status: Current Every Day Smoker    Packs/day: 0.50  . Smokeless tobacco: Never Used  . Tobacco comment: 10 cigs/day  Vaping Use  . Vaping Use: Never used  Substance Use Topics  . Alcohol use: No  . Drug use: No   ROS See pertinent in HPI. All other systems reviewed and non contributory  Blood pressure 140/89, pulse 87, weight 208 lb (94.3 kg), last menstrual period 09/18/2020.  GENERAL: Well-developed, well-nourished female in no acute distress.  HEENT: Normocephalic, atraumatic. Sclerae anicteric.  NECK: Supple. Normal thyroid.  LUNGS: Clear to auscultation bilaterally.  HEART: Regular rate and rhythm. BREASTS: Symmetric in size. No palpable masses or lymphadenopathy, skin changes, or nipple drainage. ABDOMEN: Soft, nontender, nondistended. No organomegaly. PELVIC: Normal external female genitalia. Vagina is pink and rugated.  Normal discharge. Normal appearing cervix. Uterus is normal in size.  No adnexal mass or tenderness. EXTREMITIES:  No cyanosis, clubbing, or edema, 2+ distal pulses.  05/2020 ultrasound FINDINGS:  Uterus: Measures 13.3 x 7.1 x 7.5 cm. Two large fibroids are present measuring 4 cm and 3.4 cm.  Endometrial stripe: Measures 1 cm.  Free fluid: None.  Ovaries: Right ovary measures 3.9 x 2.3 x 2.1 cm. Left ovary measures 2.3 x 1.9 x 1.8 cm. Both ovaries demonstrate multiple follicles.  Adnexa: No mass.    IMPRESSION:  1. Two large uterine fibroids, measuring up to 4 cm and 3.4 cm.   2. Mildly thickened endometrial stripe measuring 1 cm, which may be secondary to the secretary phase of menstruation.   3. Additional findings as detailed above.   Electronically Signed by: Shara Blazing Specimen Collected: 06/05/20 10:47 AM Last Resulted: 06/05/20 10:49 AM    07/2020 endometrial biopsy - benign proliferative endometrium - No malignancy   A/P 49 yo with AUB likely due perimenopausal period - Discussed surgical management with endometrial ablation vs hysterectomy - Patient opted to proceed with endometrial ablation. Risks, benefits and alternatives were explained including but not limited to risks of bleeding, infection and damage to adjacent organs. Patient verbalized understanding and all questions were answered - Patient will be scheduled for endometrial ablation

## 2020-09-24 ENCOUNTER — Other Ambulatory Visit: Payer: Self-pay | Admitting: Physician Assistant

## 2020-09-24 DIAGNOSIS — Z0000 Encounter for general adult medical examination without abnormal findings: Secondary | ICD-10-CM

## 2020-11-05 ENCOUNTER — Ambulatory Visit
Admission: RE | Admit: 2020-11-05 | Discharge: 2020-11-05 | Disposition: A | Discharge status: Home / Self Care | Payer: Medicaid Other | Source: Ambulatory Visit | Attending: Physician Assistant | Admitting: Physician Assistant

## 2020-11-05 ENCOUNTER — Other Ambulatory Visit: Payer: Self-pay

## 2020-11-05 DIAGNOSIS — Z0000 Encounter for general adult medical examination without abnormal findings: Secondary | ICD-10-CM

## 2020-11-10 ENCOUNTER — Other Ambulatory Visit: Payer: Self-pay | Admitting: Physician Assistant

## 2020-11-10 DIAGNOSIS — R928 Other abnormal and inconclusive findings on diagnostic imaging of breast: Secondary | ICD-10-CM

## 2020-11-12 ENCOUNTER — Encounter (HOSPITAL_BASED_OUTPATIENT_CLINIC_OR_DEPARTMENT_OTHER): Payer: Self-pay | Admitting: Obstetrics and Gynecology

## 2020-11-16 ENCOUNTER — Encounter (HOSPITAL_BASED_OUTPATIENT_CLINIC_OR_DEPARTMENT_OTHER): Payer: Self-pay | Admitting: Obstetrics and Gynecology

## 2020-11-16 NOTE — H&P (Incomplete)
Latoya Klein is an 50 y.o. female P4 with menorrhagia with regular cycles presenting today for further management. Patient with history of 10-14 day monthly menses currently managed with Megace. She is without any complaints except for persistent prolonged menses on megace and desires for endometrial ablation.   Pertinent Gynecological History: Menses: flow is moderate, regular every month without intermenstrual spotting and usually lasting 10 to 14 days Bleeding: dysfunctional uterine bleeding Contraception: none DES exposure: denies Blood transfusions: none Last mammogram: abnormal: needs follow up Date: 10/2020    Menstrual History: No LMP recorded.    Past Medical History:  Diagnosis Date  . Allergy   . Anemia   . GERD (gastroesophageal reflux disease)   . Hepatitis C   . Hypertension   . Substance abuse ([facility])    Drugs in the past. No drugs in the past 10 years,  . Vaginal Pap smear, abnormal     No past surgical history on file.  Family History  Problem Relation Age of Onset  . Diabetes Mother   . Cancer Father        colon  . Colon cancer Father        dx age 86's  . Breast cancer Neg Hx   . Esophageal cancer Neg Hx   . Rectal cancer Neg Hx   . Stomach cancer Neg Hx     Social History:  reports that she has been smoking. She has been smoking about 0.50 packs per day. She has never used smokeless tobacco. She reports that she does not drink alcohol and does not use drugs.  Allergies: No Known Allergies  No medications prior to admission.    Review of Systems See pertinent in HPI There were no vitals taken for this visit. Physical Exam GENERAL: Well-developed, well-nourished female in no acute distress.  HEENT: Normocephalic, atraumatic. Sclerae anicteric.  LUNGS: Clear to auscultation bilaterally.  HEART: Regular rate and rhythm. ABDOMEN: Soft, nontender, nondistended. No organomegaly. PELVIC: Deferred to OR EXTREMITIES: No cyanosis, clubbing, or  edema, 2+ distal pulses.  No results found for this or any previous visit (from the past 24 hour(s)).  No results found. 07/2020 endometrial biopsy FINAL MICROSCOPIC DIAGNOSIS:   A. ENDOMETRIUM, BIOPSY:  - Proliferative endometrium, benign.   Assessment/Plan: 50 yo P4 with AUB here for scheduled endometrial ablation - Risks, benefits and alternatives were explained including but not limited to risks of bleeding, infection, uterine perforation and damage to adjacent organs. Patient verbalized understanding and all questions were answered  Qadir Folks 11/16/2020, 9:27 AM

## 2020-11-16 NOTE — Progress Notes (Signed)
Unable to reach pt via phone after several attempt's since last week.  We have been able to leave message's, but not return phone call. Left message earlier today letting pt know if she is having surgery tomorrow she will need to have covid test today and we need to do pre-op and if she was not having surgery to please call dr Jolayne Panther office and/ or [place] (OR scheduler) to reschedule. Tried again and able to leave message for pt to arrive at 0900 tomorrow at [facility] (address given) put up under covered drive and call front desk (number given) stay in car and nurse will come out to do covid test from car then she is to stay in car parked in front lot until she receives call from [facility] that her test is negative and she can come check-in.  She is to be npo after midnight tonight , nothing by mouth, bring insurance card, photo ID.  Sent Dr Jolayne Panther inbox message in epic letting her know unable to reach pt.

## 2020-11-17 ENCOUNTER — Encounter (HOSPITAL_BASED_OUTPATIENT_CLINIC_OR_DEPARTMENT_OTHER): Admission: RE | Payer: Self-pay | Source: Home / Self Care

## 2020-11-17 ENCOUNTER — Ambulatory Visit (HOSPITAL_BASED_OUTPATIENT_CLINIC_OR_DEPARTMENT_OTHER)
Admission: RE | Admit: 2020-11-17 | Payer: Medicaid Other | Source: Home / Self Care | Admitting: Obstetrics and Gynecology

## 2020-11-17 HISTORY — DX: Excessive and frequent menstruation with regular cycle: N92.0

## 2020-11-17 HISTORY — DX: Iron deficiency anemia, unspecified: D50.9

## 2020-11-17 HISTORY — DX: Anxiety disorder, unspecified: F41.9

## 2020-11-17 HISTORY — DX: Personal history of other diseases of the female genital tract: Z87.42

## 2020-11-17 HISTORY — DX: Depression, unspecified: F32.A

## 2020-11-17 HISTORY — DX: Herpesviral infection of urogenital system, unspecified: A60.00

## 2020-11-17 HISTORY — DX: Leiomyoma of uterus, unspecified: D25.9

## 2020-11-17 HISTORY — DX: Other psychoactive substance abuse, in remission: F19.11

## 2020-11-17 SURGERY — DILATATION & CURETTAGE/HYSTEROSCOPY WITH HYDROTHERMAL ABLATION
Anesthesia: Choice

## 2020-11-17 NOTE — Progress Notes (Signed)
Unable to reach patient for surgery. Several messages left on her voicemail. Surgery canceled.

## 2020-11-27 ENCOUNTER — Ambulatory Visit
Admission: RE | Admit: 2020-11-27 | Discharge: 2020-11-27 | Disposition: A | Discharge status: Home / Self Care | Payer: Medicaid Other | Source: Ambulatory Visit | Attending: Physician Assistant | Admitting: Physician Assistant

## 2020-11-27 ENCOUNTER — Other Ambulatory Visit: Payer: Self-pay

## 2020-11-27 DIAGNOSIS — R928 Other abnormal and inconclusive findings on diagnostic imaging of breast: Secondary | ICD-10-CM

## 2021-01-08 DIAGNOSIS — N898 Other specified noninflammatory disorders of vagina: Secondary | ICD-10-CM | POA: Diagnosis not present

## 2021-01-08 DIAGNOSIS — Z8619 Personal history of other infectious and parasitic diseases: Secondary | ICD-10-CM | POA: Diagnosis not present

## 2021-03-30 DIAGNOSIS — R0602 Shortness of breath: Secondary | ICD-10-CM | POA: Diagnosis not present

## 2021-03-30 DIAGNOSIS — R5383 Other fatigue: Secondary | ICD-10-CM | POA: Diagnosis not present

## 2021-03-30 DIAGNOSIS — I10 Essential (primary) hypertension: Secondary | ICD-10-CM | POA: Diagnosis not present

## 2021-03-30 DIAGNOSIS — R079 Chest pain, unspecified: Secondary | ICD-10-CM | POA: Diagnosis not present

## 2021-03-30 DIAGNOSIS — F411 Generalized anxiety disorder: Secondary | ICD-10-CM | POA: Diagnosis not present

## 2021-04-13 DIAGNOSIS — F411 Generalized anxiety disorder: Secondary | ICD-10-CM | POA: Diagnosis not present

## 2021-04-13 DIAGNOSIS — E559 Vitamin D deficiency, unspecified: Secondary | ICD-10-CM | POA: Diagnosis not present

## 2021-04-13 DIAGNOSIS — I10 Essential (primary) hypertension: Secondary | ICD-10-CM | POA: Diagnosis not present

## 2021-04-13 DIAGNOSIS — F410 Panic disorder [episodic paroxysmal anxiety] without agoraphobia: Secondary | ICD-10-CM | POA: Diagnosis not present

## 2021-04-13 DIAGNOSIS — K219 Gastro-esophageal reflux disease without esophagitis: Secondary | ICD-10-CM | POA: Diagnosis not present

## 2021-04-20 DIAGNOSIS — R079 Chest pain, unspecified: Secondary | ICD-10-CM | POA: Diagnosis not present

## 2021-04-20 DIAGNOSIS — R9431 Abnormal electrocardiogram [ECG] [EKG]: Secondary | ICD-10-CM | POA: Diagnosis not present

## 2021-04-20 DIAGNOSIS — I10 Essential (primary) hypertension: Secondary | ICD-10-CM | POA: Diagnosis not present

## 2021-05-20 DIAGNOSIS — R079 Chest pain, unspecified: Secondary | ICD-10-CM | POA: Diagnosis not present

## 2021-05-20 DIAGNOSIS — I10 Essential (primary) hypertension: Secondary | ICD-10-CM | POA: Diagnosis not present

## 2021-05-27 DIAGNOSIS — Z23 Encounter for immunization: Secondary | ICD-10-CM | POA: Diagnosis not present

## 2021-05-27 DIAGNOSIS — N870 Mild cervical dysplasia: Secondary | ICD-10-CM | POA: Diagnosis not present

## 2021-05-27 DIAGNOSIS — R87612 Low grade squamous intraepithelial lesion on cytologic smear of cervix (LGSIL): Secondary | ICD-10-CM | POA: Diagnosis not present

## 2021-06-22 DIAGNOSIS — R0681 Apnea, not elsewhere classified: Secondary | ICD-10-CM | POA: Diagnosis not present

## 2021-06-22 DIAGNOSIS — I10 Essential (primary) hypertension: Secondary | ICD-10-CM | POA: Diagnosis not present

## 2021-06-22 DIAGNOSIS — E6609 Other obesity due to excess calories: Secondary | ICD-10-CM | POA: Diagnosis not present

## 2021-06-22 DIAGNOSIS — Z6832 Body mass index (BMI) 32.0-32.9, adult: Secondary | ICD-10-CM | POA: Diagnosis not present

## 2021-06-22 DIAGNOSIS — R0683 Snoring: Secondary | ICD-10-CM | POA: Diagnosis not present

## 2021-06-25 DIAGNOSIS — R058 Other specified cough: Secondary | ICD-10-CM | POA: Diagnosis not present

## 2021-06-25 DIAGNOSIS — H938X3 Other specified disorders of ear, bilateral: Secondary | ICD-10-CM | POA: Diagnosis not present

## 2021-06-25 DIAGNOSIS — B182 Chronic viral hepatitis C: Secondary | ICD-10-CM | POA: Diagnosis not present

## 2021-06-25 DIAGNOSIS — J029 Acute pharyngitis, unspecified: Secondary | ICD-10-CM | POA: Diagnosis not present

## 2021-08-20 DIAGNOSIS — E7849 Other hyperlipidemia: Secondary | ICD-10-CM | POA: Diagnosis not present

## 2021-08-20 DIAGNOSIS — R0789 Other chest pain: Secondary | ICD-10-CM | POA: Diagnosis not present

## 2021-08-20 DIAGNOSIS — I10 Essential (primary) hypertension: Secondary | ICD-10-CM | POA: Diagnosis not present

## 2021-08-20 DIAGNOSIS — R9431 Abnormal electrocardiogram [ECG] [EKG]: Secondary | ICD-10-CM | POA: Diagnosis not present

## 2021-09-23 DIAGNOSIS — J069 Acute upper respiratory infection, unspecified: Secondary | ICD-10-CM | POA: Diagnosis not present

## 2021-10-11 ENCOUNTER — Other Ambulatory Visit: Payer: Self-pay | Admitting: Physician Assistant

## 2021-10-11 DIAGNOSIS — Z1231 Encounter for screening mammogram for malignant neoplasm of breast: Secondary | ICD-10-CM

## 2021-11-17 ENCOUNTER — Ambulatory Visit
Admission: RE | Admit: 2021-11-17 | Discharge: 2021-11-17 | Disposition: A | Discharge status: Home / Self Care | Payer: Medicaid Other | Source: Ambulatory Visit | Attending: Physician Assistant | Admitting: Physician Assistant

## 2021-11-17 DIAGNOSIS — Z1231 Encounter for screening mammogram for malignant neoplasm of breast: Secondary | ICD-10-CM | POA: Diagnosis not present

## 2022-03-25 ENCOUNTER — Ambulatory Visit
Admission: EM | Admit: 2022-03-25 | Discharge: 2022-03-25 | Disposition: A | Discharge status: Home / Self Care | Payer: Medicaid Other | Attending: Family Medicine | Admitting: Family Medicine

## 2022-03-25 DIAGNOSIS — N3090 Cystitis, unspecified without hematuria: Secondary | ICD-10-CM | POA: Diagnosis not present

## 2022-03-25 LAB — POCT URINALYSIS DIP (MANUAL ENTRY)
Glucose, UA: NEGATIVE mg/dL
Leukocytes, UA: NEGATIVE
Nitrite, UA: NEGATIVE
Spec Grav, UA: 1.025 (ref 1.010–1.025)
Urobilinogen, UA: 1 E.U./dL
pH, UA: 6.5 (ref 5.0–8.0)

## 2022-03-25 MED ORDER — NITROFURANTOIN MONOHYD MACRO 100 MG PO CAPS: 100.0000 mg | ORAL_CAPSULE | Freq: Two times a day (BID) | ORAL | 0 refills | Status: AC

## 2022-03-25 NOTE — Discharge Instructions (Addendum)
The urinalysis was abnormal; urine culture is sent; if it looks like the antibiotic needs to be changes, staff will call you. ? ?Take nitrofurantoin 100 mg--1 capsule twice daily for 7 days ? ? ?

## 2022-03-25 NOTE — ED Provider Notes (Signed)
?[facility] ? ? ? ?CSN: [id number] ?Arrival date & time: 03/25/22  1454 ? ? ?  ? ?History   ?Chief Complaint ?Chief Complaint  ?Patient presents with  ? Dysuria  ? ? ?HPI ?Latoya Klein is a 51 y.o. female.  ? ? ?Dysuria ?Here for rlq pain on urination for about 3 weeks. Also hving frequency and incomplete bladder emptying. No dysuria or hematuria. Has NOT had vaginal dc or itching. ? ?Past Medical History:  ?Diagnosis Date  ? Anxiety   ? Depression   ? Genital herpes   ? GERD (gastroesophageal reflux disease)   ? History of abnormal cervical Pap smear   ? History of hepatitis C 2001  ? per pt dx 2001 and completed medication  ? History of PID   ? History of substance abuse ([place])   ? Hypertension   ? IDA (iron deficiency anemia)   ? Menorrhagia   ? Uterine leiomyoma   ? ? ?Patient Active Problem List  ? Diagnosis Date Noted  ? Vaginal ulcer 07/09/2012  ? Vaginitis 05/26/2011  ? ? ?Past Surgical History:  ?Procedure Laterality Date  ? COLONOSCOPY WITH PROPOFOL  07-16-2020  dr h. danis  ? ? ?OB History   ? ? Gravida  ?5  ? Para  ?4  ? Term  ?4  ? Preterm  ?0  ? AB  ?1  ? Living  ?4  ?  ? ? SAB  ?0  ? IAB  ?0  ? Ectopic  ?0  ? Multiple  ?0  ? Live Births  ?4  ?   ?  ?  ? ? ? ?Home Medications   ? ?Prior to Admission medications   ?Medication Sig Start Date End Date Taking? Authorizing Provider  ?nitrofurantoin, macrocrystal-monohydrate, (MACROBID) 100 MG capsule Take 1 capsule (100 mg total) by mouth 2 (two) times daily. 03/25/22  Yes Barrett Henle, MD  ?albuterol (PROVENTIL HFA;VENTOLIN HFA) 108 (90 BASE) MCG/ACT inhaler Inhale 2 puffs into the lungs every 4 (four) hours as needed for wheezing or shortness of breath. 04/18/14   Janne Napoleon, NP  ?amLODipine (NORVASC) 5 MG tablet Take 5 mg by mouth daily.    [provider]  ?ibuprofen (ADVIL) 200 MG tablet Take 200-600 mg by mouth every 6 (six) hours as needed for mild pain or moderate pain.    [provider]  ?lisinopril  (ZESTRIL) 10 MG tablet Take 10 mg by mouth daily.    [provider]  ?loratadine (CLARITIN) 10 MG tablet Take 10 mg by mouth daily.    [provider]  ?pantoprazole (PROTONIX) 40 MG tablet Take 1 tablet (40 mg total) by mouth daily. ?Patient taking differently: Take 40 mg by mouth daily as needed (heartburn).  09/30/13   Kingsley Spittle, MD  ?valACYclovir (VALTREX) 500 MG tablet Take 500 mg by mouth daily.    [provider]  ? ? ?Family History ?Family History  ?Problem Relation Age of Onset  ? Diabetes Mother   ? Cancer Father   ?     colon  ? Colon cancer Father   ?     dx age 9's  ? Breast cancer Neg Hx   ? Esophageal cancer Neg Hx   ? Rectal cancer Neg Hx   ? Stomach cancer Neg Hx   ? ? ?Social History ?Social History  ? ?Tobacco Use  ? Smoking status: Every Day  ?  Packs/day: 0.50  ?  Types: Cigarettes  ?  Smokeless tobacco: Never  ? Tobacco comments:  ?  10 cigs/day  ?Vaping Use  ? Vaping Use: Never used  ?Substance Use Topics  ? Alcohol use: No  ? Drug use: No  ? ? ? ?Allergies   ?Patient has no known allergies. ? ? ?Review of Systems ?Review of Systems  ?Genitourinary:  Positive for dysuria.  ? ? ?Physical Exam ?Triage Vital Signs ?ED Triage Vitals [03/25/22 1544]  ?Enc Vitals Group  ?   BP 123/85  ?   Pulse Rate 80  ?   Resp 18  ?   Temp 98.4 ?F (36.9 ?C)  ?   Temp Source Oral  ?   SpO2 98 %  ?   Weight   ?   Height   ?   Head Circumference   ?   Peak Flow   ?   Pain Score 0  ?   Pain Loc   ?   Pain Edu?   ?   Excl. in [place]?   ? ?No data found. ? ?Updated Vital Signs ?BP 123/85 (BP Location: Left Arm)   Pulse 80   Temp 98.4 ?F (36.9 ?C) (Oral)   Resp 18   LMP 06/14/2021 (Approximate)   SpO2 98%  ? ?Visual Acuity ?Right Eye Distance:   ?Left Eye Distance:   ?Bilateral Distance:   ? ?Right Eye Near:   ?Left Eye Near:    ?Bilateral Near:    ? ?Physical Exam ?Vitals reviewed.  ?Constitutional:   ?   General: She is not in acute distress. ?   Appearance: She is not toxic-appearing.   ?HENT:  ?   Mouth/Throat:  ?   Mouth: Mucous membranes are moist.  ?Eyes:  ?   Extraocular Movements: Extraocular movements intact.  ?   Pupils: Pupils are equal, round, and reactive to light.  ?Cardiovascular:  ?   Rate and Rhythm: Normal rate and regular rhythm.  ?   Heart sounds: No murmur heard. ?Pulmonary:  ?   Effort: Pulmonary effort is normal.  ?   Breath sounds: Normal breath sounds.  ?Abdominal:  ?   Palpations: Abdomen is soft.  ?   Tenderness: There is no abdominal tenderness.  ?Musculoskeletal:  ?   Cervical back: Neck supple.  ?Lymphadenopathy:  ?   Cervical: No cervical adenopathy.  ?Skin: ?   Capillary Refill: Capillary refill takes less than 2 seconds.  ?   Coloration: Skin is not jaundiced or pale.  ?Neurological:  ?   General: No focal deficit present.  ?   Mental Status: She is alert and oriented to person, place, and time.  ?Psychiatric:     ?   Behavior: Behavior normal.  ? ? ? ?UC Treatments / Results  ?Labs ?(all labs ordered are listed, but only abnormal results are displayed) ?Labs Reviewed  ?POCT URINALYSIS DIP (MANUAL ENTRY) - Abnormal; Notable for the following components:  ?    Result Value  ? Color, UA orange (*)   ? Bilirubin, UA small (*)   ? Ketones, POC UA trace (5) (*)   ? Blood, UA trace-lysed (*)   ? Protein Ur, POC trace (*)   ? All other components within normal limits  ?URINE CULTURE  ? ? ?EKG ? ? ?Radiology ?No results found. ? ?Procedures ?Procedures (including critical care time) ? ?Medications Ordered in UC ?Medications - No data to display ? ?Initial Impression / Assessment and Plan / UC Course  ?I have reviewed the triage  vital signs and the nursing notes. ? ?Pertinent labs & imaging results that were available during my care of the patient were reviewed by me and considered in my medical decision making (see chart for details). ? ?  ? ?Urinalysis shows some blood and protein. Will treat for UTI, and will send c/s. ?Final Clinical Impressions(s) / UC Diagnoses   ? ?Final diagnoses:  ?Cystitis  ? ? ? ?Discharge Instructions   ? ?  ?The urinalysis was abnormal; urine culture is sent; if it looks like the antibiotic needs to be changes, staff will call you. ? ?Take nitrofurantoin 100 mg--1 capsule twice daily for 7 days ? ? ? ? ? ? ?ED Prescriptions   ? ? Medication Sig Dispense Auth. Provider  ? nitrofurantoin, macrocrystal-monohydrate, (MACROBID) 100 MG capsule Take 1 capsule (100 mg total) by mouth 2 (two) times daily. 10 capsule Barrett Henle, MD  ? ?  ? ?PDMP not reviewed this encounter. ?  ?Barrett Henle, MD ?03/25/22 1617 ? ?

## 2022-03-25 NOTE — ED Triage Notes (Signed)
Pt c/o dysuria,  ? ?Denies frequency, urgency, oliguria, hematuria, pelvic or new lower back pain, vaginal discharge, malodors ? ?Onset ~ 3 weeks ago  ?

## 2022-03-26 LAB — URINE CULTURE: Culture: NO GROWTH

## 2022-05-18 DIAGNOSIS — E7849 Other hyperlipidemia: Secondary | ICD-10-CM | POA: Diagnosis not present

## 2022-05-18 DIAGNOSIS — R9431 Abnormal electrocardiogram [ECG] [EKG]: Secondary | ICD-10-CM | POA: Diagnosis not present

## 2022-05-18 DIAGNOSIS — R0789 Other chest pain: Secondary | ICD-10-CM | POA: Diagnosis not present

## 2022-05-18 DIAGNOSIS — I10 Essential (primary) hypertension: Secondary | ICD-10-CM | POA: Diagnosis not present

## 2022-08-02 DIAGNOSIS — Z6835 Body mass index (BMI) 35.0-35.9, adult: Secondary | ICD-10-CM | POA: Diagnosis not present

## 2022-08-02 DIAGNOSIS — M545 Low back pain, unspecified: Secondary | ICD-10-CM | POA: Diagnosis not present

## 2022-08-02 DIAGNOSIS — Z79899 Other long term (current) drug therapy: Secondary | ICD-10-CM | POA: Diagnosis not present

## 2022-08-02 DIAGNOSIS — R030 Elevated blood-pressure reading, without diagnosis of hypertension: Secondary | ICD-10-CM | POA: Diagnosis not present

## 2022-08-02 DIAGNOSIS — M25569 Pain in unspecified knee: Secondary | ICD-10-CM | POA: Diagnosis not present

## 2022-08-02 DIAGNOSIS — M129 Arthropathy, unspecified: Secondary | ICD-10-CM | POA: Diagnosis not present

## 2022-08-02 DIAGNOSIS — R5383 Other fatigue: Secondary | ICD-10-CM | POA: Diagnosis not present

## 2022-08-02 DIAGNOSIS — Z780 Asymptomatic menopausal state: Secondary | ICD-10-CM | POA: Diagnosis not present

## 2022-08-02 DIAGNOSIS — Z0000 Encounter for general adult medical examination without abnormal findings: Secondary | ICD-10-CM | POA: Diagnosis not present

## 2022-08-02 DIAGNOSIS — R0602 Shortness of breath: Secondary | ICD-10-CM | POA: Diagnosis not present

## 2022-08-02 DIAGNOSIS — G8929 Other chronic pain: Secondary | ICD-10-CM | POA: Diagnosis not present

## 2022-08-02 DIAGNOSIS — M542 Cervicalgia: Secondary | ICD-10-CM | POA: Diagnosis not present

## 2022-08-02 DIAGNOSIS — E559 Vitamin D deficiency, unspecified: Secondary | ICD-10-CM | POA: Diagnosis not present

## 2022-08-04 DIAGNOSIS — Z79899 Other long term (current) drug therapy: Secondary | ICD-10-CM | POA: Diagnosis not present

## 2022-08-09 DIAGNOSIS — E785 Hyperlipidemia, unspecified: Secondary | ICD-10-CM | POA: Diagnosis not present

## 2022-08-09 DIAGNOSIS — M545 Low back pain, unspecified: Secondary | ICD-10-CM | POA: Diagnosis not present

## 2022-08-09 DIAGNOSIS — Z87891 Personal history of nicotine dependence: Secondary | ICD-10-CM | POA: Diagnosis not present

## 2022-08-09 DIAGNOSIS — R030 Elevated blood-pressure reading, without diagnosis of hypertension: Secondary | ICD-10-CM | POA: Diagnosis not present

## 2022-08-09 DIAGNOSIS — J45909 Unspecified asthma, uncomplicated: Secondary | ICD-10-CM | POA: Diagnosis not present

## 2022-08-09 DIAGNOSIS — G4733 Obstructive sleep apnea (adult) (pediatric): Secondary | ICD-10-CM | POA: Diagnosis not present

## 2022-08-09 DIAGNOSIS — G8929 Other chronic pain: Secondary | ICD-10-CM | POA: Diagnosis not present

## 2022-08-09 DIAGNOSIS — I10 Essential (primary) hypertension: Secondary | ICD-10-CM | POA: Diagnosis not present

## 2022-08-09 DIAGNOSIS — Z79899 Other long term (current) drug therapy: Secondary | ICD-10-CM | POA: Diagnosis not present

## 2022-08-09 DIAGNOSIS — K219 Gastro-esophageal reflux disease without esophagitis: Secondary | ICD-10-CM | POA: Diagnosis not present

## 2022-08-09 DIAGNOSIS — Z6835 Body mass index (BMI) 35.0-35.9, adult: Secondary | ICD-10-CM | POA: Diagnosis not present

## 2022-08-09 DIAGNOSIS — R7309 Other abnormal glucose: Secondary | ICD-10-CM | POA: Diagnosis not present

## 2022-08-18 DIAGNOSIS — Z6835 Body mass index (BMI) 35.0-35.9, adult: Secondary | ICD-10-CM | POA: Diagnosis not present

## 2022-08-18 DIAGNOSIS — J45909 Unspecified asthma, uncomplicated: Secondary | ICD-10-CM | POA: Diagnosis not present

## 2022-08-18 DIAGNOSIS — I10 Essential (primary) hypertension: Secondary | ICD-10-CM | POA: Diagnosis not present

## 2022-08-18 DIAGNOSIS — G4710 Hypersomnia, unspecified: Secondary | ICD-10-CM | POA: Diagnosis not present

## 2022-08-18 DIAGNOSIS — Z780 Asymptomatic menopausal state: Secondary | ICD-10-CM | POA: Diagnosis not present

## 2022-08-18 DIAGNOSIS — K219 Gastro-esophageal reflux disease without esophagitis: Secondary | ICD-10-CM | POA: Diagnosis not present

## 2022-10-18 ENCOUNTER — Other Ambulatory Visit: Payer: Self-pay | Admitting: Physician Assistant

## 2022-10-18 DIAGNOSIS — Z1231 Encounter for screening mammogram for malignant neoplasm of breast: Secondary | ICD-10-CM

## 2022-12-14 ENCOUNTER — Ambulatory Visit
Admission: RE | Admit: 2022-12-14 | Discharge: 2022-12-14 | Disposition: A | Discharge status: Home / Self Care | Payer: Medicaid Other | Source: Ambulatory Visit | Attending: Physician Assistant | Admitting: Physician Assistant

## 2022-12-14 DIAGNOSIS — Z1231 Encounter for screening mammogram for malignant neoplasm of breast: Secondary | ICD-10-CM

## 2023-11-20 ENCOUNTER — Ambulatory Visit
Admission: EM | Admit: 2023-11-20 | Discharge: 2023-11-20 | Disposition: A | Discharge status: Home / Self Care | Payer: Medicaid Other | Attending: Internal Medicine | Admitting: Internal Medicine

## 2023-11-20 DIAGNOSIS — J0110 Acute frontal sinusitis, unspecified: Secondary | ICD-10-CM | POA: Diagnosis not present

## 2023-11-20 LAB — POCT INFLUENZA A/B
Influenza A, POC: NEGATIVE
Influenza B, POC: NEGATIVE

## 2023-11-20 MED ORDER — METHYLPREDNISOLONE ACETATE 40 MG/ML IJ SUSP: 40.0000 mg | Freq: Once | INTRAMUSCULAR | Status: DC

## 2023-11-20 MED ORDER — PROMETHAZINE-DM 6.25-15 MG/5ML PO SYRP: 5.0000 mL | ORAL_SOLUTION | Freq: Three times a day (TID) | ORAL | 0 refills | Status: AC | PRN

## 2023-11-20 MED ORDER — AZITHROMYCIN 250 MG PO TABS: 250.0000 mg | ORAL_TABLET | Freq: Every day | ORAL | 0 refills | Status: AC

## 2023-11-20 MED ORDER — ACETAMINOPHEN 325 MG PO TABS
650.0000 mg | ORAL_TABLET | Freq: Once | ORAL | Status: AC
  Administered 2023-11-20: 650 mg via ORAL

## 2023-11-20 MED ORDER — ALBUTEROL SULFATE HFA 108 (90 BASE) MCG/ACT IN AERS: 2.0000 | INHALATION_SPRAY | RESPIRATORY_TRACT | 3 refills | Status: DC | PRN

## 2023-11-20 MED ORDER — PREDNISONE 10 MG PO TABS: 40.0000 mg | ORAL_TABLET | Freq: Every day | ORAL | 0 refills | Status: AC

## 2023-11-20 MED ORDER — METHYLPREDNISOLONE ACETATE 80 MG/ML IJ SUSP
40.0000 mg | Freq: Once | INTRAMUSCULAR | Status: AC
  Administered 2023-11-20: 40 mg via INTRAMUSCULAR

## 2023-11-20 MED ORDER — ALBUTEROL SULFATE HFA 108 (90 BASE) MCG/ACT IN AERS: 2.0000 | INHALATION_SPRAY | RESPIRATORY_TRACT | 3 refills | Status: AC | PRN

## 2023-11-20 MED ORDER — LORATADINE 10 MG PO TABS: 10.0000 mg | ORAL_TABLET | Freq: Every day | ORAL | 3 refills | Status: AC

## 2023-11-20 NOTE — ED Provider Notes (Signed)
 [facility]    CSN: [id number] Arrival date & time: 11/20/23  1040      History   Chief Complaint Chief Complaint  Patient presents with   Fever   Cough   Sore Throat   Headache    HPI Latoya Klein is a 53 y.o. female.   54 year old female who presents urgent care with complaints of headache, congestion, sinus pain, fevers, chills, poor appetite.  Her symptoms started about 4 days ago.  It started with a headache that progressed to a runny nose.  Then she developed congestion with frontal and maxillary sinus pain followed by a cough.  She is having difficulty sleeping at night due to the cough and congestion.  She started having fevers and chills last night.  She has not had an appetite in several days but is trying to stay hydrated.  She denies nausea, vomiting, diarrhea, shortness of breath, chest pain or other constitutional symptoms.  She does report that her daughter was sick first but has already gotten better.     Fever Associated symptoms: chills, congestion, cough, headaches and rhinorrhea   Associated symptoms: no chest pain, no dysuria, no ear pain, no rash, no sore throat and no vomiting   Cough Associated symptoms: chills, fever, headaches and rhinorrhea   Associated symptoms: no chest pain, no ear pain, no rash, no shortness of breath and no sore throat   Sore Throat Associated symptoms include headaches. Pertinent negatives include no chest pain, no abdominal pain and no shortness of breath.  Headache Associated symptoms: congestion, cough, fever and sinus pressure   Associated symptoms: no abdominal pain, no back pain, no ear pain, no eye pain, no seizures, no sore throat and no vomiting     Past Medical History:  Diagnosis Date   Anxiety    Depression    Genital herpes    GERD (gastroesophageal reflux disease)    History of abnormal cervical Pap smear    History of hepatitis C 2001   per pt dx 2001 and completed medication   History  of PID    History of substance abuse ([facility])    Hypertension    IDA (iron deficiency anemia)    Menorrhagia    Uterine leiomyoma     Patient Active Problem List   Diagnosis Date Noted   Vaginal ulcer 07/09/2012   Vaginitis 05/26/2011    Past Surgical History:  Procedure Laterality Date   COLONOSCOPY WITH PROPOFOL  07-16-2020  dr h. danis    OB History     Gravida  5   Para  4   Term  4   Preterm  0   AB  1   Living  4      SAB  0   IAB  0   Ectopic  0   Multiple  0   Live Births  4            Home Medications    Prior to Admission medications   Medication Sig Start Date End Date Taking? Authorizing Provider  albuterol  (PROVENTIL  HFA;VENTOLIN  HFA) 108 (90 BASE) MCG/ACT inhaler Inhale 2 puffs into the lungs every 4 (four) hours as needed for wheezing or shortness of breath. 04/18/14  Yes Mabe, Alm, NP  amLODipine  (NORVASC ) 5 MG tablet Take 5 mg by mouth daily.   Yes [provider]  ibuprofen  (ADVIL ) 200 MG tablet Take 200-600 mg by mouth every 6 (six) hours as needed for mild pain or  moderate pain.   Yes [provider]  lisinopril  (ZESTRIL ) 10 MG tablet Take 10 mg by mouth daily.   Yes [provider]  loratadine  (CLARITIN ) 10 MG tablet Take 10 mg by mouth daily.   Yes [provider]  nitrofurantoin , macrocrystal-monohydrate, (MACROBID ) 100 MG capsule Take 1 capsule (100 mg total) by mouth 2 (two) times daily. 03/25/22   Vonna Sharlet POUR, MD  pantoprazole  (PROTONIX ) 40 MG tablet Take 1 tablet (40 mg total) by mouth daily. Patient taking differently: Take 40 mg by mouth daily as needed (heartburn). 09/30/13  Yes Parks Sharper, MD  valACYclovir  (VALTREX ) 500 MG tablet Take 500 mg by mouth daily.   Yes [provider]    Family History Family History  Problem Relation Age of Onset   Diabetes Mother    Cancer Father        colon   Colon cancer Father        dx age 51's   Breast cancer Neg Hx     Esophageal cancer Neg Hx    Rectal cancer Neg Hx    Stomach cancer Neg Hx     Social History Social History   Tobacco Use   Smoking status: Every Day    Current packs/day: 0.50    Types: Cigarettes   Smokeless tobacco: Never   Tobacco comments:    10 cigs/day  Vaping Use   Vaping status: Never Used  Substance Use Topics   Alcohol use: No   Drug use: No     Allergies   Patient has no known allergies.   Review of Systems Review of Systems  Constitutional:  Positive for appetite change, chills and fever.  HENT:  Positive for congestion, rhinorrhea, sinus pressure and sinus pain. Negative for ear pain and sore throat.   Eyes:  Negative for pain and visual disturbance.  Respiratory:  Positive for cough. Negative for shortness of breath.   Cardiovascular:  Negative for chest pain and palpitations.  Gastrointestinal:  Negative for abdominal pain and vomiting.  Genitourinary:  Negative for dysuria and hematuria.  Musculoskeletal:  Negative for arthralgias and back pain.  Skin:  Negative for color change and rash.  Neurological:  Positive for headaches. Negative for seizures and syncope.  All other systems reviewed and are negative.    Physical Exam Triage Vital Signs ED Triage Vitals [11/20/23 1131]  Encounter Vitals Group     BP (!) 148/92     Systolic BP Percentile      Diastolic BP Percentile      Pulse Rate (!) 102     Resp 18     Temp 100.1 F (37.8 C)     Temp Source Oral     SpO2 98 %     Weight      Height      Head Circumference      Peak Flow      Pain Score      Pain Loc      Pain Education      Exclude from Growth Chart    No data found.  Updated Vital Signs BP (!) 148/92 (BP Location: Left Arm)   Pulse (!) 102   Temp 100.1 F (37.8 C) (Oral)   Resp 18   LMP 06/14/2021 (Approximate)   SpO2 98%   Visual Acuity Right Eye Distance:   Left Eye Distance:   Bilateral Distance:    Right Eye Near:   Left Eye Near:    Bilateral  Near:      Physical Exam Vitals and nursing note reviewed.  Constitutional:      General: She is not in acute distress.    Appearance: She is well-developed.  HENT:     Head: Normocephalic and atraumatic.     Right Ear: Tympanic membrane normal.     Left Ear: Tympanic membrane is erythematous.     Nose: Congestion present.     Mouth/Throat:     Mouth: Mucous membranes are moist.     Pharynx: Posterior oropharyngeal erythema present.     Tonsils: No tonsillar exudate or tonsillar abscesses.  Eyes:     Conjunctiva/sclera: Conjunctivae normal.  Cardiovascular:     Rate and Rhythm: Normal rate and regular rhythm.     Heart sounds: No murmur heard. Pulmonary:     Effort: Pulmonary effort is normal. No respiratory distress.     Breath sounds: Examination of the right-upper field reveals wheezing. Examination of the left-upper field reveals wheezing. Wheezing present.  Abdominal:     Palpations: Abdomen is soft.     Tenderness: There is no abdominal tenderness.  Musculoskeletal:        General: No swelling.     Cervical back: Neck supple.  Skin:    General: Skin is warm and dry.     Capillary Refill: Capillary refill takes less than 2 seconds.  Neurological:     Mental Status: She is alert.  Psychiatric:        Mood and Affect: Mood normal.      UC Treatments / Results  Labs (all labs ordered are listed, but only abnormal results are displayed) Labs Reviewed  POCT INFLUENZA A/B    EKG   Radiology No results found.  Procedures Procedures (including critical care time)  Medications Ordered in UC Medications  acetaminophen  (TYLENOL ) tablet 650 mg (650 mg Oral Given 11/20/23 1136)    Initial Impression / Assessment and Plan / UC Course  I have reviewed the triage vital signs and the nursing notes.  Pertinent labs & imaging results that were available during my care of the patient were reviewed by me and considered in my medical decision making (see chart for details).      Acute non-recurrent frontal sinusitis   Flu A and B are negative. Symptoms most consistent with an acute bacterial sinus infection. This will likely take 5-10 days to completely resolve. We will treat with the following:  Azithromycin  250mg  Take 2 tablets today and the 1 tablet daily for 4 more days. Prednisone  40 mg daily for 5 days. Take this in the morning. Start on 11/21/2023. Promethazine  DM 5 mL every 8 hours as needed for cough.  Use caution as this medication can cause drowsiness. Albuterol  inhaler 1-2 puffs every 6 hours as needed for wheezing, shortness of breath Claritin  10 mg daily.  Rest and stay hydrated. Drink plenty of fluids to avoid dehydration. Return to urgent care or PCP if symptoms worsen or fail to resolve.    Final Clinical Impressions(s) / UC Diagnoses   Final diagnoses:  None   Discharge Instructions   None    ED Prescriptions   None    PDMP not reviewed this encounter.   Teresa Almarie LABOR, [place] 11/20/23 1211

## 2023-11-20 NOTE — ED Triage Notes (Signed)
 Cough,congestion,headache and sore throat x 3 days.Patient took Motrin 3 hours ago.

## 2023-11-20 NOTE — Discharge Instructions (Addendum)
 Flu A and B are negative. Symptoms most consistent with an acute bacterial sinus infection. This will likely take 5-10 days to completely resolve. We will treat with the following:  Azithromycin  250mg  Take 2 tablets today and the 1 tablet daily for 4 more days. Prednisone  40 mg daily for 5 days. Take this in the morning. Start on 11/21/2023. Promethazine  DM 5 mL every 8 hours as needed for cough.  Use caution as this medication can cause drowsiness. Albuterol  inhaler 1-2 puffs every 6 hours as needed for wheezing, shortness of breath Claritin  10 mg daily.  Rest and stay hydrated. Drink plenty of fluids to avoid dehydration. Return to urgent care or PCP if symptoms worsen or fail to resolve.

## 2023-11-29 ENCOUNTER — Other Ambulatory Visit: Payer: Self-pay | Admitting: Physician Assistant

## 2023-11-29 DIAGNOSIS — Z1231 Encounter for screening mammogram for malignant neoplasm of breast: Secondary | ICD-10-CM

## 2023-12-19 ENCOUNTER — Ambulatory Visit
Admission: RE | Admit: 2023-12-19 | Discharge: 2023-12-19 | Disposition: A | Discharge status: Home / Self Care | Payer: Medicaid Other | Source: Ambulatory Visit | Attending: Physician Assistant | Admitting: Physician Assistant

## 2023-12-19 DIAGNOSIS — Z1231 Encounter for screening mammogram for malignant neoplasm of breast: Secondary | ICD-10-CM
